# Patient Record
Sex: Female | Born: 1985 | Hispanic: No | Marital: Married | Smoking: Never smoker
Health system: Southern US, Community
[De-identification: ages and names within clinical notes are randomized; demographics above are authoritative.]

## PROBLEM LIST (undated history)

## (undated) ENCOUNTER — Inpatient Hospital Stay (HOSPITAL_COMMUNITY): Payer: Self-pay

## (undated) DIAGNOSIS — D649 Anemia, unspecified: Secondary | ICD-10-CM

## (undated) DIAGNOSIS — R002 Palpitations: Secondary | ICD-10-CM

## (undated) HISTORY — PX: TONSILLECTOMY: SUR1361

## (undated) HISTORY — DX: Palpitations: R00.2

## (undated) HISTORY — DX: Anemia, unspecified: D64.9

---

## 2007-07-10 ENCOUNTER — Emergency Department (HOSPITAL_COMMUNITY): Admission: EM | Admit: 2007-07-10 | Discharge: 2007-07-10 | Payer: Self-pay | Admitting: Emergency Medicine

## 2007-08-18 ENCOUNTER — Emergency Department (HOSPITAL_COMMUNITY): Admission: EM | Admit: 2007-08-18 | Discharge: 2007-08-18 | Payer: Self-pay | Admitting: Emergency Medicine

## 2008-02-10 ENCOUNTER — Inpatient Hospital Stay (HOSPITAL_COMMUNITY): Admission: RE | Admit: 2008-02-10 | Discharge: 2008-02-12 | Payer: Self-pay | Admitting: Obstetrics

## 2008-07-08 ENCOUNTER — Emergency Department (HOSPITAL_COMMUNITY): Admission: EM | Admit: 2008-07-08 | Discharge: 2008-07-08 | Payer: Self-pay | Admitting: Emergency Medicine

## 2008-09-14 ENCOUNTER — Ambulatory Visit (HOSPITAL_COMMUNITY): Admission: RE | Admit: 2008-09-14 | Discharge: 2008-09-14 | Payer: Self-pay | Admitting: Obstetrics

## 2009-01-05 ENCOUNTER — Inpatient Hospital Stay (HOSPITAL_COMMUNITY): Admission: AD | Admit: 2009-01-05 | Discharge: 2009-01-07 | Payer: Self-pay | Admitting: Obstetrics

## 2009-07-18 ENCOUNTER — Ambulatory Visit: Payer: Self-pay | Admitting: Gynecology

## 2009-07-18 ENCOUNTER — Inpatient Hospital Stay (HOSPITAL_COMMUNITY): Admission: AD | Admit: 2009-07-18 | Discharge: 2009-07-18 | Payer: Self-pay | Admitting: Obstetrics

## 2009-12-20 ENCOUNTER — Emergency Department (HOSPITAL_COMMUNITY)
Admission: EM | Admit: 2009-12-20 | Discharge: 2009-12-20 | Disposition: A | Discharge status: Other Acute Inpatient Hospital | Payer: Self-pay | Source: Home / Self Care | Admitting: Emergency Medicine

## 2009-12-22 ENCOUNTER — Inpatient Hospital Stay (HOSPITAL_COMMUNITY): Admission: AD | Admit: 2009-12-22 | Discharge: 2009-12-22 | Payer: Self-pay | Admitting: Obstetrics

## 2010-02-12 ENCOUNTER — Encounter: Payer: Self-pay | Admitting: Obstetrics

## 2010-02-20 ENCOUNTER — Inpatient Hospital Stay (HOSPITAL_COMMUNITY)
Admission: RE | Admit: 2010-02-20 | Discharge: 2010-02-22 | DRG: 775 | Disposition: A | Discharge status: Home / Self Care | Payer: Medicaid Other | Attending: Obstetrics | Admitting: Obstetrics

## 2010-02-20 ENCOUNTER — Encounter (HOSPITAL_COMMUNITY): Payer: Self-pay | Admitting: Obstetrics

## 2010-02-20 LAB — CBC
HCT: 27.4 % — ABNORMAL LOW (ref 36.0–46.0)
Hemoglobin: 8.1 g/dL — ABNORMAL LOW (ref 12.0–15.0)
MCH: 20.3 pg — ABNORMAL LOW (ref 26.0–34.0)
MCHC: 29.6 g/dL — ABNORMAL LOW (ref 30.0–36.0)
MCV: 68.5 fL — ABNORMAL LOW (ref 78.0–100.0)
Platelets: 244 10*3/uL (ref 150–400)
RBC: 4 MIL/uL (ref 3.87–5.11)
RDW: 17.9 % — ABNORMAL HIGH (ref 11.5–15.5)
WBC: 7.8 10*3/uL (ref 4.0–10.5)

## 2010-02-20 LAB — RPR: RPR Ser Ql: NONREACTIVE

## 2010-02-21 LAB — CBC
HCT: 22.5 % — ABNORMAL LOW (ref 36.0–46.0)
Hemoglobin: 6.7 g/dL — CL (ref 12.0–15.0)
MCH: 20.6 pg — ABNORMAL LOW (ref 26.0–34.0)
MCHC: 29.8 g/dL — ABNORMAL LOW (ref 30.0–36.0)
MCV: 69 fL — ABNORMAL LOW (ref 78.0–100.0)
Platelets: 200 10*3/uL (ref 150–400)
RBC: 3.26 MIL/uL — ABNORMAL LOW (ref 3.87–5.11)
RDW: 18 % — ABNORMAL HIGH (ref 11.5–15.5)
WBC: 7.3 10*3/uL (ref 4.0–10.5)

## 2010-04-03 LAB — URINE CULTURE
Colony Count: >=100000
Culture  Setup Time: 201112030321

## 2010-04-03 LAB — URINALYSIS, ROUTINE W REFLEX MICROSCOPIC
Bilirubin Urine: NEGATIVE
Glucose, UA: NEGATIVE mg/dL
Ketones, ur: NEGATIVE mg/dL
Nitrite: NEGATIVE
Protein, ur: NEGATIVE mg/dL
Specific Gravity, Urine: 1.025 (ref 1.005–1.030)
Urobilinogen, UA: 0.2 mg/dL (ref 0.0–1.0)
pH: 6.5 (ref 5.0–8.0)

## 2010-04-03 LAB — URINE MICROSCOPIC-ADD ON

## 2010-04-08 LAB — CBC
HCT: 32.3 % — ABNORMAL LOW (ref 36.0–46.0)
Hemoglobin: 11 g/dL — ABNORMAL LOW (ref 12.0–15.0)
MCH: 28.1 pg (ref 26.0–34.0)
MCHC: 34.2 g/dL (ref 30.0–36.0)
MCV: 82.4 fL (ref 78.0–100.0)
Platelets: 301 10*3/uL (ref 150–400)
RBC: 3.92 MIL/uL (ref 3.87–5.11)
RDW: 13.7 % (ref 11.5–15.5)
WBC: 7.6 10*3/uL (ref 4.0–10.5)

## 2010-04-08 LAB — DIFFERENTIAL
Basophils Absolute: 0 10*3/uL (ref 0.0–0.1)
Basophils Relative: 0 % (ref 0–1)
Eosinophils Absolute: 0.1 10*3/uL (ref 0.0–0.7)
Eosinophils Relative: 1 % (ref 0–5)
Lymphocytes Relative: 13 % (ref 12–46)
Lymphs Abs: 1 10*3/uL (ref 0.7–4.0)
Monocytes Absolute: 0.5 10*3/uL (ref 0.1–1.0)
Monocytes Relative: 7 % (ref 3–12)
Neutro Abs: 6.1 10*3/uL (ref 1.7–7.7)
Neutrophils Relative %: 79 % — ABNORMAL HIGH (ref 43–77)

## 2010-04-08 LAB — URINALYSIS, ROUTINE W REFLEX MICROSCOPIC
Bilirubin Urine: NEGATIVE
Glucose, UA: NEGATIVE mg/dL
Hgb urine dipstick: NEGATIVE
Ketones, ur: NEGATIVE mg/dL
Nitrite: NEGATIVE
Protein, ur: NEGATIVE mg/dL
Specific Gravity, Urine: 1.015 (ref 1.005–1.030)
Urobilinogen, UA: 0.2 mg/dL (ref 0.0–1.0)
pH: 7 (ref 5.0–8.0)

## 2010-04-08 LAB — WET PREP, GENITAL
Clue Cells Wet Prep HPF POC: NONE SEEN
Trich, Wet Prep: NONE SEEN

## 2010-04-08 LAB — POCT PREGNANCY, URINE: Preg Test, Ur: POSITIVE

## 2010-04-08 LAB — URINE CULTURE: Colony Count: 30000

## 2010-04-08 LAB — GC/CHLAMYDIA PROBE AMP, GENITAL
Chlamydia, DNA Probe: NEGATIVE
GC Probe Amp, Genital: NEGATIVE

## 2010-04-08 LAB — URINE MICROSCOPIC-ADD ON

## 2010-04-08 LAB — HCG, QUANTITATIVE, PREGNANCY: hCG, Beta Chain, Quant, S: 325655 m[IU]/mL — ABNORMAL HIGH (ref <5)

## 2010-04-08 LAB — ABO/RH: ABO/RH(D): AB POS

## 2010-04-23 LAB — CBC
HCT: 22.1 % — ABNORMAL LOW (ref 36.0–46.0)
HCT: 26.3 % — ABNORMAL LOW (ref 36.0–46.0)
Hemoglobin: 7 g/dL — ABNORMAL LOW (ref 12.0–15.0)
Hemoglobin: 8.3 g/dL — ABNORMAL LOW (ref 12.0–15.0)
MCHC: 31.4 g/dL (ref 30.0–36.0)
MCHC: 31.6 g/dL (ref 30.0–36.0)
MCV: 67.7 fL — ABNORMAL LOW (ref 78.0–100.0)
MCV: 68.6 fL — ABNORMAL LOW (ref 78.0–100.0)
Platelets: 228 10*3/uL (ref 150–400)
Platelets: 277 10*3/uL (ref 150–400)
RBC: 3.23 MIL/uL — ABNORMAL LOW (ref 3.87–5.11)
RBC: 3.88 MIL/uL (ref 3.87–5.11)
RDW: 19.4 % — ABNORMAL HIGH (ref 11.5–15.5)
RDW: 19.4 % — ABNORMAL HIGH (ref 11.5–15.5)
WBC: 7.3 10*3/uL (ref 4.0–10.5)
WBC: 8.8 10*3/uL (ref 4.0–10.5)

## 2010-04-23 LAB — RPR: RPR Ser Ql: NONREACTIVE

## 2010-04-30 LAB — URINALYSIS, ROUTINE W REFLEX MICROSCOPIC
Bilirubin Urine: NEGATIVE
Glucose, UA: NEGATIVE mg/dL
Hgb urine dipstick: NEGATIVE
Ketones, ur: NEGATIVE mg/dL
Nitrite: NEGATIVE
Protein, ur: NEGATIVE mg/dL
Specific Gravity, Urine: 1.026 (ref 1.005–1.030)
Urobilinogen, UA: 0.2 mg/dL (ref 0.0–1.0)
pH: 7 (ref 5.0–8.0)

## 2010-04-30 LAB — URINE MICROSCOPIC-ADD ON

## 2010-04-30 LAB — BASIC METABOLIC PANEL
BUN: 9 mg/dL (ref 6–23)
CO2: 21 mEq/L (ref 19–32)
Calcium: 9.6 mg/dL (ref 8.4–10.5)
Chloride: 105 mEq/L (ref 96–112)
Creatinine, Ser: 0.47 mg/dL (ref 0.4–1.2)
GFR calc Af Amer: >60 mL/min (ref >60)
GFR calc non Af Amer: >60 mL/min (ref >60)
Glucose, Bld: 99 mg/dL (ref 70–99)
Potassium: 3.3 mEq/L — ABNORMAL LOW (ref 3.5–5.1)
Sodium: 137 mEq/L (ref 135–145)

## 2010-04-30 LAB — CBC
HCT: 34.8 % — ABNORMAL LOW (ref 36.0–46.0)
Hemoglobin: 11.8 g/dL — ABNORMAL LOW (ref 12.0–15.0)
MCHC: 34 g/dL (ref 30.0–36.0)
MCV: 81.9 fL (ref 78.0–100.0)
Platelets: 354 10*3/uL (ref 150–400)
RBC: 4.24 MIL/uL (ref 3.87–5.11)
RDW: 12.7 % (ref 11.5–15.5)
WBC: 7.2 10*3/uL (ref 4.0–10.5)

## 2010-04-30 LAB — D-DIMER, QUANTITATIVE: D-Dimer, Quant: 0.62 ug/mL-FEU — ABNORMAL HIGH (ref 0.00–0.48)

## 2010-05-07 LAB — CBC
HCT: 20.7 % — ABNORMAL LOW (ref 36.0–46.0)
HCT: 28.1 % — ABNORMAL LOW (ref 36.0–46.0)
Hemoglobin: 6.8 g/dL — CL (ref 12.0–15.0)
Hemoglobin: 9.3 g/dL — ABNORMAL LOW (ref 12.0–15.0)
MCHC: 32.6 g/dL (ref 30.0–36.0)
MCHC: 32.9 g/dL (ref 30.0–36.0)
MCV: 74.5 fL — ABNORMAL LOW (ref 78.0–100.0)
MCV: 74.9 fL — ABNORMAL LOW (ref 78.0–100.0)
Platelets: 206 10*3/uL (ref 150–400)
Platelets: 331 10*3/uL (ref 150–400)
RBC: 2.77 MIL/uL — ABNORMAL LOW (ref 3.87–5.11)
RBC: 3.78 MIL/uL — ABNORMAL LOW (ref 3.87–5.11)
RDW: 17.2 % — ABNORMAL HIGH (ref 11.5–15.5)
RDW: 17.8 % — ABNORMAL HIGH (ref 11.5–15.5)
WBC: 7 10*3/uL (ref 4.0–10.5)
WBC: 9.8 10*3/uL (ref 4.0–10.5)

## 2010-05-07 LAB — RPR: RPR Ser Ql: NONREACTIVE

## 2010-10-18 LAB — URINALYSIS, ROUTINE W REFLEX MICROSCOPIC
Bilirubin Urine: NEGATIVE
Glucose, UA: NEGATIVE
Hgb urine dipstick: NEGATIVE
Ketones, ur: 40 — AB
Nitrite: NEGATIVE
Protein, ur: NEGATIVE
Specific Gravity, Urine: 1.015
Urobilinogen, UA: 0.2
pH: 5.5

## 2010-10-18 LAB — URINE MICROSCOPIC-ADD ON

## 2010-10-18 LAB — GC/CHLAMYDIA PROBE AMP, GENITAL
Chlamydia, DNA Probe: NEGATIVE
GC Probe Amp, Genital: NEGATIVE

## 2010-10-18 LAB — WET PREP, GENITAL
Trich, Wet Prep: NONE SEEN
Yeast Wet Prep HPF POC: NONE SEEN

## 2010-10-18 LAB — HCG, QUANTITATIVE, PREGNANCY: hCG, Beta Chain, Quant, S: >200000 — ABNORMAL HIGH

## 2010-10-18 LAB — ABO/RH: ABO/RH(D): AB POS

## 2010-10-19 LAB — COMPREHENSIVE METABOLIC PANEL
ALT: 22
AST: 25
Albumin: 3.4 — ABNORMAL LOW
Alkaline Phosphatase: 40
BUN: 5 — ABNORMAL LOW
CO2: 26
Calcium: 9.2
Chloride: 106
Creatinine, Ser: 0.31 — ABNORMAL LOW
GFR calc Af Amer: >60
GFR calc non Af Amer: >60
Glucose, Bld: 85
Potassium: 3.6
Sodium: 136
Total Bilirubin: 0.4
Total Protein: 6.5

## 2010-10-19 LAB — CBC
HCT: 29.9 — ABNORMAL LOW
Hemoglobin: 10.3 — ABNORMAL LOW
MCHC: 34.3
MCV: 86.9
Platelets: 272
RBC: 3.44 — ABNORMAL LOW
RDW: 13.5
WBC: 7.1

## 2010-10-19 LAB — DIFFERENTIAL
Basophils Absolute: 0.1
Basophils Relative: 1
Eosinophils Absolute: 0.1
Eosinophils Relative: 1
Lymphocytes Relative: 21
Lymphs Abs: 1.5
Monocytes Absolute: 0.5
Monocytes Relative: 7
Neutro Abs: 5
Neutrophils Relative %: 70

## 2010-10-19 LAB — LIPASE, BLOOD: Lipase: 19

## 2010-12-24 LAB — OB RESULTS CONSOLE GC/CHLAMYDIA
Chlamydia: NEGATIVE
Gonorrhea: NEGATIVE

## 2010-12-24 LAB — OB RESULTS CONSOLE RUBELLA ANTIBODY, IGM: Rubella: IMMUNE

## 2010-12-24 LAB — OB RESULTS CONSOLE HIV ANTIBODY (ROUTINE TESTING): HIV: NONREACTIVE

## 2010-12-24 LAB — OB RESULTS CONSOLE ABO/RH: RH Type: POSITIVE

## 2010-12-24 LAB — OB RESULTS CONSOLE ANTIBODY SCREEN: Antibody Screen: NEGATIVE

## 2010-12-24 LAB — OB RESULTS CONSOLE RPR: RPR: NONREACTIVE

## 2010-12-24 LAB — OB RESULTS CONSOLE HEPATITIS B SURFACE ANTIGEN: Hepatitis B Surface Ag: NEGATIVE

## 2011-01-22 NOTE — L&D Delivery Note (Signed)
Delivery Note At 6:39 PM a viable female was delivered via  (Presentation: Left Occiput Anterior).  APGAR: 9, 9; weight .   Placenta status: Intact, Spontaneous.  Cord: 3 vessels with the following complications: None.  Cord pH: not done  Anesthesia: Epidural  Episiotomy:  Lacerations: 2nd degree Suture Repair: 2.0 vicryl Est. Blood Loss (mL):   Mom to postpartum.  Baby to nursery-stable.  Nuno Brubacher A 06/19/2011, 7:15 PM

## 2011-03-25 ENCOUNTER — Other Ambulatory Visit (HOSPITAL_COMMUNITY): Payer: Self-pay | Admitting: Obstetrics

## 2011-03-25 DIAGNOSIS — Z3689 Encounter for other specified antenatal screening: Secondary | ICD-10-CM

## 2011-03-26 ENCOUNTER — Ambulatory Visit (HOSPITAL_COMMUNITY)
Admission: RE | Admit: 2011-03-26 | Discharge: 2011-03-26 | Disposition: A | Discharge status: Home / Self Care | Payer: Medicaid Other | Source: Ambulatory Visit | Attending: Obstetrics | Admitting: Obstetrics

## 2011-03-26 DIAGNOSIS — Z363 Encounter for antenatal screening for malformations: Secondary | ICD-10-CM | POA: Insufficient documentation

## 2011-03-26 DIAGNOSIS — Z1389 Encounter for screening for other disorder: Secondary | ICD-10-CM | POA: Insufficient documentation

## 2011-03-26 DIAGNOSIS — O358XX0 Maternal care for other (suspected) fetal abnormality and damage, not applicable or unspecified: Secondary | ICD-10-CM | POA: Insufficient documentation

## 2011-03-26 DIAGNOSIS — Z3689 Encounter for other specified antenatal screening: Secondary | ICD-10-CM

## 2011-06-07 ENCOUNTER — Telehealth (HOSPITAL_COMMUNITY): Payer: Self-pay | Admitting: *Deleted

## 2011-06-07 ENCOUNTER — Encounter (HOSPITAL_COMMUNITY): Payer: Self-pay | Admitting: *Deleted

## 2011-06-07 NOTE — Telephone Encounter (Signed)
Preadmission screen  

## 2011-06-11 ENCOUNTER — Encounter (HOSPITAL_COMMUNITY): Payer: Self-pay | Admitting: *Deleted

## 2011-06-11 ENCOUNTER — Telehealth (HOSPITAL_COMMUNITY): Payer: Self-pay | Admitting: *Deleted

## 2011-06-11 NOTE — Telephone Encounter (Signed)
Preadmission screen  

## 2011-06-18 ENCOUNTER — Encounter (HOSPITAL_COMMUNITY): Payer: Self-pay | Admitting: *Deleted

## 2011-06-18 ENCOUNTER — Inpatient Hospital Stay (HOSPITAL_COMMUNITY)
Admission: AD | Admit: 2011-06-18 | Discharge: 2011-06-18 | Disposition: A | Discharge status: Hospice | Payer: Medicaid Other | Source: Ambulatory Visit | Attending: Obstetrics | Admitting: Obstetrics

## 2011-06-18 DIAGNOSIS — O479 False labor, unspecified: Secondary | ICD-10-CM | POA: Insufficient documentation

## 2011-06-18 DIAGNOSIS — S3140XA Unspecified open wound of vagina and vulva, initial encounter: Secondary | ICD-10-CM | POA: Insufficient documentation

## 2011-06-18 NOTE — MAU Note (Signed)
Pt reports pain in lower back and some bleeding. States it feels like the baby is pushing down

## 2011-06-18 NOTE — Discharge Instructions (Signed)
Braxton Hicks Contractions Pregnancy is commonly associated with contractions of the uterus throughout the pregnancy. Towards the end of pregnancy (32 to 34 weeks), these contractions [facility] Rough) can develop more often and may become more forceful. This is not true labor because these contractions do not result in opening (dilatation) and thinning of the cervix. They are sometimes difficult to tell apart from true labor because these contractions can be forceful and people have different pain tolerances. You should not feel embarrassed if you go to the hospital with false labor. Sometimes, the only way to tell if you are in true labor is for your caregiver to follow the changes in the cervix. How to tell the difference between true and false labor:  False labor.   The contractions of false labor are usually shorter, irregular and not as hard as those of true labor.   They are often felt in the front of the lower abdomen and in the groin.   They may leave with walking around or changing positions while lying down.   They get weaker and are shorter lasting as time goes on.   These contractions are usually irregular.   They do not usually become progressively stronger, regular and closer together as with true labor.   True labor.   Contractions in true labor last 30 to 70 seconds, become very regular, usually become more intense, and increase in frequency.   They do not go away with walking.   The discomfort is usually felt in the top of the uterus and spreads to the lower abdomen and low back.   True labor can be determined by your caregiver with an exam. This will show that the cervix is dilating and getting thinner.  If there are no prenatal problems or other health problems associated with the pregnancy, it is completely safe to be sent home with false labor and await the onset of true labor. HOME CARE INSTRUCTIONS   Keep up with your usual exercises and instructions.   Take  medications as directed.   Keep your regular prenatal appointment.   Eat and drink lightly if you think you are going into labor.   If BH contractions are making you uncomfortable:   Change your activity position from lying down or resting to walking/walking to resting.   Sit and rest in a tub of warm water.   Drink 2 to 3 glasses of water. Dehydration may cause B-H contractions.   Do slow and deep breathing several times an hour.  SEEK IMMEDIATE MEDICAL CARE IF:   Your contractions continue to become stronger, more regular, and closer together.   You have a gushing, burst or leaking of fluid from the vagina.   An oral temperature above 102 F (38.9 C) develops.   You have passage of blood-tinged mucus.   You develop vaginal bleeding.   You develop continuous belly (abdominal) pain.   You have low back pain that you never had before.   You feel the baby's head pushing down causing pelvic pressure.   The baby is not moving as much as it used to.  Document Released: 01/07/2005 Document Revised: 12/27/2010 Document Reviewed: 07/01/2008 [facility] Patient Information 2012 [place], [place].Labor Induction  Most women go into labor on their own between 41 and 42 weeks of the pregnancy. When this does not happen or when there is a medical need, medicine or other methods may be used to induce labor. Labor induction causes a pregnant woman's uterus to contract. It  also causes the cervix to soften (ripen), open (dilate), and thin out (efface). Usually, labor is not induced before 39 weeks of the pregnancy unless there is a problem with the baby or mother. Whether your labor will be induced depends on a number of factors, including the following:  The medical condition of you and the baby.   How many weeks along you are.   The status of baby's lung maturity.   The condition of the cervix.   The position of the baby.  REASONS FOR LABOR INDUCTION  The health of the baby or mother  is at risk.   The pregnancy is overdue by 1 week or more.   The water breaks but labor does not start on its own.   The mother has a health condition or serious illness such as high blood pressure, infection, placental abruption, or diabetes.   The amniotic fluid amounts are low around the baby.   The baby is distressed.  REASONS TO NOT INDUCE LABOR Labor induction may not be a good idea if:  It is shown that your baby does not tolerate labor.   An induction is just more convenient.   You want the baby to be born on a certain date, like a holiday.   You have had previous surgeries on your uterus, such as a myomectomy or the removal of fibroids.   Your placenta lies very low in the uterus and blocks the opening of the cervix (placenta previa).   Your baby is not in a head down position.   The umbilical cord drops down into the birth canal in front of the baby. This could cut off the baby's blood and oxygen supply.   You have had a previous cesarean delivery.   There areunusual circumstances, such as the baby being extremely premature.  RISKS AND COMPLICATIONS Problems may occur in the process of induction and plans may need to be modified as a situation unfolds. Some of the risks of induction include:  Change in fetal heart rate, such as too high, too low, or erratic.   Risk of fetal distress.   Risk of infection to mother and baby.   Increased chance of having a cesarean delivery.   The rare, but increased chance that the placenta will separate from the uterus (abruption).   Uterine rupture (very rare).  When induction is needed for medical reasons, the benefits of induction may outweigh the risks. BEFORE THE PROCEDURE Your caregiver will check your cervix and the baby's position. This will help your caregiver decide if you are far enough along for an induction to work. PROCEDURE Several methods of labor induction may be used, such as:   Taking prostaglandin  medicine to dilate and ripen the cervix. The medicine will also start contractions. It can be taken by mouth or by inserting a suppository into the vagina.   A thin tube (catheter) with a balloon on the end may be inserted into your vagina to dilate the cervix. Once inserted, the balloon expands with water, which causes the cervix to open.   Striping the membranes. Your caregiver inserts a finger between the cervix and membranes, which causes the cervix to be stretched and may cause the uterus to contract. This is often done during an office visit. You will be sent home to wait for the contractions to begin. You will then come in for an induction.   Breaking the water. Your caregiver will make a hole in the amniotic sac using  a small instrument. Once the amniotic sac breaks, contractions should begin. This may still take hours to see an effect.   Taking medicine to trigger or strengthen contractions. This medicine is given intravenously through a tube in your arm.  All of the methods of induction, besides stripping the membranes, will be done in the hospital. Induction is done in the hospital so that you and the baby can be carefully monitored. AFTER THE PROCEDURE Some inductions can take up to 2 or 3 days. Depending on the cervix, it usually takes less time. It takes longer when you are induced early in the pregnancy or if this is your first pregnancy. If a mother is still pregnant and the induction has been going on for 2 to 3 days, either the mother will be sent home or a cesarean delivery will be needed. Document Released: 05/29/2006 Document Revised: 12/27/2010 Document Reviewed: 11/12/2010 [facility] Patient Information 2012 [place], [place].

## 2011-06-18 NOTE — MAU Provider Note (Signed)
Chief Complaint:  Labor Eval    First Provider Initiated Contact with Patient 06/18/11 0134      April Blanchard is  26 y.o. [id number].  No LMP recorded. Patient is pregnant..  [redacted]w[redacted]d   She presents complaining of Labor Eval  Reports ? Labial bleeding from wax bikini wax earlier today. States bleeding started after wax. Only spotting on occasion with wiping.  Obstetrical/Gynecological History: OB History    Grav Para Term Preterm Abortions TAB SAB Ect Mult Living   4 3 3       3       Past Medical History: Past Medical History  Diagnosis Date  . Asthma     albuterol i month ago  . Anemia   . Palpitations     Past Surgical History: Past Surgical History  Procedure Date  . Tonsillectomy     Family History: Family History  Problem Relation Age of Onset  . Diabetes Mother   . Asthma Brother   . Asthma Maternal Uncle   . Diabetes Maternal Grandmother     Social History: History  Substance Use Topics  . Smoking status: Never Smoker   . Smokeless tobacco: Never Used  . Alcohol Use: No    Allergies: No Known Allergies  Prescriptions prior to admission  Medication Sig Dispense Refill  . albuterol (PROVENTIL HFA;VENTOLIN HFA) 108 (90 BASE) MCG/ACT inhaler Inhale 2 puffs into the lungs every 6 (six) hours as needed. As needed for asthma      . ferrous sulfate 325 (65 FE) MG tablet Take 325 mg by mouth daily with breakfast.      . PRESCRIPTION MEDICATION Take 1 tablet by mouth daily. Patient states she has an antibiotic she takes 1 tablet daily for 7 days but does not know the name of the drug. Please follow-up        Review of Systems - Negative except what has been reviewed in the HPI  Physical Exam   Blood pressure 112/65, pulse 100, temperature 97.4 F (36.3 C), temperature source Oral, resp. rate 16, height 5\' 2"  (1.575 m), weight 172 lb (78.019 kg), SpO2 100.00%.  General: General appearance - alert, well appearing, and in no distress and oriented to person, place,  and time Mental status - alert, oriented to person, place, and time, normal mood, behavior, speech, dress, motor activity, and thought processes, affect appropriate to mood Abdomen - gravid, non tender Focused Gynecological Exam: VULVA: left labial lac noted, hemostatic, VAGINA: normal appearing vagina with normal color and discharge, no lesions, CERVIX: FT/50/ballotable/post per RN exam  Assessment: False Labor Stable Left Labial Laceration  Plan: Discharge home Labor precautions Reassurance given and comfort measures for labial lac FU Wednesday as scheduled for IOL  Wallice Granville E. 06/18/2011,1:48 AM

## 2011-06-19 ENCOUNTER — Encounter (HOSPITAL_COMMUNITY): Payer: Self-pay | Admitting: Anesthesiology

## 2011-06-19 ENCOUNTER — Inpatient Hospital Stay (HOSPITAL_COMMUNITY): Payer: Medicaid Other | Admitting: Anesthesiology

## 2011-06-19 ENCOUNTER — Encounter (HOSPITAL_COMMUNITY): Payer: Self-pay

## 2011-06-19 ENCOUNTER — Inpatient Hospital Stay (HOSPITAL_COMMUNITY)
Admission: RE | Admit: 2011-06-19 | Discharge: 2011-06-21 | DRG: 775 | Disposition: A | Discharge status: Home / Self Care | Payer: Medicaid Other | Source: Ambulatory Visit | Attending: Obstetrics | Admitting: Obstetrics

## 2011-06-19 LAB — RPR: RPR Ser Ql: NONREACTIVE

## 2011-06-19 LAB — OB RESULTS CONSOLE GBS: GBS: NEGATIVE

## 2011-06-19 LAB — CBC
HCT: 30.5 % — ABNORMAL LOW (ref 36.0–46.0)
Hemoglobin: 9.3 g/dL — ABNORMAL LOW (ref 12.0–15.0)
MCH: 23.3 pg — ABNORMAL LOW (ref 26.0–34.0)
MCHC: 30.5 g/dL (ref 30.0–36.0)
MCV: 76.4 fL — ABNORMAL LOW (ref 78.0–100.0)
Platelets: 208 10*3/uL (ref 150–400)
RBC: 3.99 MIL/uL (ref 3.87–5.11)
RDW: 20.8 % — ABNORMAL HIGH (ref 11.5–15.5)
WBC: 6 10*3/uL (ref 4.0–10.5)

## 2011-06-19 MED ORDER — LACTATED RINGERS IV SOLN
INTRAVENOUS | Status: DC
  Administered 2011-06-19: 1000 mL via INTRAVENOUS

## 2011-06-19 MED ORDER — LANOLIN HYDROUS EX OINT: TOPICAL_OINTMENT | CUTANEOUS | Status: DC | PRN

## 2011-06-19 MED ORDER — ACETAMINOPHEN 325 MG PO TABS
650.0000 mg | ORAL_TABLET | ORAL | Status: DC | PRN
  Administered 2011-06-19: 650 mg via ORAL
  Filled 2011-06-19: qty 2

## 2011-06-19 MED ORDER — EPHEDRINE 5 MG/ML INJ: 10.0000 mg | INTRAVENOUS | Status: DC | PRN

## 2011-06-19 MED ORDER — SODIUM BICARBONATE 8.4 % IV SOLN
INTRAVENOUS | Status: DC | PRN
  Administered 2011-06-19: 5 mL via EPIDURAL

## 2011-06-19 MED ORDER — TERBUTALINE SULFATE 1 MG/ML IJ SOLN: 0.2500 mg | Freq: Once | INTRAMUSCULAR | Status: DC | PRN

## 2011-06-19 MED ORDER — PRENATAL MULTIVITAMIN CH
1.0000 | ORAL_TABLET | Freq: Every day | ORAL | Status: DC
  Administered 2011-06-20: 1 via ORAL
  Administered 2011-06-21: 12:00:00 via ORAL
  Filled 2011-06-19 (×2): qty 1

## 2011-06-19 MED ORDER — SIMETHICONE 80 MG PO CHEW: 80.0000 mg | CHEWABLE_TABLET | ORAL | Status: DC | PRN

## 2011-06-19 MED ORDER — ONDANSETRON HCL 4 MG PO TABS: 4.0000 mg | ORAL_TABLET | ORAL | Status: DC | PRN

## 2011-06-19 MED ORDER — ERYTHROMYCIN 5 MG/GM OP OINT
TOPICAL_OINTMENT | OPHTHALMIC | Status: AC
  Filled 2011-06-19: qty 1

## 2011-06-19 MED ORDER — IBUPROFEN 600 MG PO TABS
600.0000 mg | ORAL_TABLET | Freq: Four times a day (QID) | ORAL | Status: DC
  Administered 2011-06-20 – 2011-06-21 (×6): 600 mg via ORAL
  Filled 2011-06-19 (×2): qty 1

## 2011-06-19 MED ORDER — LIDOCAINE HCL (PF) 1 % IJ SOLN
INTRAMUSCULAR | Status: DC | PRN
  Administered 2011-06-19 (×2): 8 mL

## 2011-06-19 MED ORDER — FLEET ENEMA 7-19 GM/118ML RE ENEM: 1.0000 | ENEMA | RECTAL | Status: DC | PRN

## 2011-06-19 MED ORDER — OXYTOCIN 20 UNITS IN LACTATED RINGERS INFUSION - SIMPLE: 125.0000 mL/h | Freq: Once | INTRAVENOUS | Status: DC

## 2011-06-19 MED ORDER — ALBUTEROL SULFATE HFA 108 (90 BASE) MCG/ACT IN AERS
2.0000 | INHALATION_SPRAY | RESPIRATORY_TRACT | Status: DC
  Administered 2011-06-20 – 2011-06-21 (×7): 2 via RESPIRATORY_TRACT
  Filled 2011-06-19: qty 6.7

## 2011-06-19 MED ORDER — OXYTOCIN 10 UNIT/ML IJ SOLN
INTRAMUSCULAR | Status: AC
  Administered 2011-06-19: 20 [IU]
  Filled 2011-06-19: qty 2

## 2011-06-19 MED ORDER — OXYTOCIN BOLUS FROM INFUSION
500.0000 mL | Freq: Once | INTRAVENOUS | Status: DC
  Filled 2011-06-19: qty 500

## 2011-06-19 MED ORDER — LACTATED RINGERS IV SOLN: 500.0000 mL | Freq: Once | INTRAVENOUS | Status: DC

## 2011-06-19 MED ORDER — OXYTOCIN 20 UNITS IN LACTATED RINGERS INFUSION - SIMPLE
125.0000 m[IU]/min | INTRAVENOUS | Status: DC
  Administered 2011-06-19: 12 m[IU]/min via INTRAVENOUS
  Administered 2011-06-19: 4 m[IU]/min via INTRAVENOUS
  Administered 2011-06-19: 8 m[IU]/min via INTRAVENOUS
  Administered 2011-06-19: 1 m[IU]/min via INTRAVENOUS
  Filled 2011-06-19: qty 1000

## 2011-06-19 MED ORDER — BENZOCAINE-MENTHOL 20-0.5 % EX AERO: 1.0000 "application " | INHALATION_SPRAY | CUTANEOUS | Status: DC | PRN

## 2011-06-19 MED ORDER — EPHEDRINE 5 MG/ML INJ
10.0000 mg | INTRAVENOUS | Status: DC | PRN
  Filled 2011-06-19: qty 4

## 2011-06-19 MED ORDER — DIPHENHYDRAMINE HCL 50 MG/ML IJ SOLN: 12.5000 mg | INTRAMUSCULAR | Status: DC | PRN

## 2011-06-19 MED ORDER — OXYTOCIN 20 UNITS IN LACTATED RINGERS INFUSION - SIMPLE: 1.0000 m[IU]/min | INTRAVENOUS | Status: DC

## 2011-06-19 MED ORDER — WITCH HAZEL-GLYCERIN EX PADS: 1.0000 "application " | MEDICATED_PAD | CUTANEOUS | Status: DC | PRN

## 2011-06-19 MED ORDER — TETANUS-DIPHTH-ACELL PERTUSSIS 5-2.5-18.5 LF-MCG/0.5 IM SUSP: 0.5000 mL | Freq: Once | INTRAMUSCULAR | Status: DC

## 2011-06-19 MED ORDER — LACTATED RINGERS IV SOLN
500.0000 mL | INTRAVENOUS | Status: DC | PRN
  Administered 2011-06-19: 1000 mL via INTRAVENOUS

## 2011-06-19 MED ORDER — DIPHENHYDRAMINE HCL 25 MG PO CAPS: 25.0000 mg | ORAL_CAPSULE | Freq: Four times a day (QID) | ORAL | Status: DC | PRN

## 2011-06-19 MED ORDER — SENNOSIDES-DOCUSATE SODIUM 8.6-50 MG PO TABS
2.0000 | ORAL_TABLET | Freq: Every day | ORAL | Status: DC
  Administered 2011-06-19 – 2011-06-20 (×2): 2 via ORAL

## 2011-06-19 MED ORDER — DIBUCAINE 1 % RE OINT
1.0000 "application " | TOPICAL_OINTMENT | RECTAL | Status: DC | PRN
  Administered 2011-06-19: 1 via RECTAL
  Filled 2011-06-19: qty 28

## 2011-06-19 MED ORDER — METHYLERGONOVINE MALEATE 0.2 MG/ML IJ SOLN
INTRAMUSCULAR | Status: AC
  Filled 2011-06-19: qty 1

## 2011-06-19 MED ORDER — CITRIC ACID-SODIUM CITRATE 334-500 MG/5ML PO SOLN: 30.0000 mL | ORAL | Status: DC | PRN

## 2011-06-19 MED ORDER — ONDANSETRON HCL 4 MG/2ML IJ SOLN: 4.0000 mg | INTRAMUSCULAR | Status: DC | PRN

## 2011-06-19 MED ORDER — PHENYLEPHRINE 40 MCG/ML (10ML) SYRINGE FOR IV PUSH (FOR BLOOD PRESSURE SUPPORT): 80.0000 ug | PREFILLED_SYRINGE | INTRAVENOUS | Status: DC | PRN

## 2011-06-19 MED ORDER — BUTORPHANOL TARTRATE 2 MG/ML IJ SOLN
1.0000 mg | INTRAMUSCULAR | Status: DC | PRN
  Administered 2011-06-19: 1 mg via INTRAVENOUS
  Filled 2011-06-19: qty 1

## 2011-06-19 MED ORDER — OXYCODONE-ACETAMINOPHEN 5-325 MG PO TABS
1.0000 | ORAL_TABLET | ORAL | Status: DC | PRN
  Filled 2011-06-19 (×2): qty 1

## 2011-06-19 MED ORDER — FENTANYL 2.5 MCG/ML BUPIVACAINE 1/10 % EPIDURAL INFUSION (WH - ANES)
INTRAMUSCULAR | Status: DC | PRN
  Administered 2011-06-19: 14 mL/h via EPIDURAL

## 2011-06-19 MED ORDER — ONDANSETRON HCL 4 MG/2ML IJ SOLN: 4.0000 mg | Freq: Four times a day (QID) | INTRAMUSCULAR | Status: DC | PRN

## 2011-06-19 MED ORDER — OXYCODONE-ACETAMINOPHEN 5-325 MG PO TABS
1.0000 | ORAL_TABLET | ORAL | Status: DC | PRN
  Administered 2011-06-20 – 2011-06-21 (×4): 1 via ORAL
  Filled 2011-06-19 (×2): qty 1

## 2011-06-19 MED ORDER — FERROUS SULFATE 325 (65 FE) MG PO TABS
325.0000 mg | ORAL_TABLET | Freq: Every day | ORAL | Status: DC
  Administered 2011-06-20 – 2011-06-21 (×2): 325 mg via ORAL
  Filled 2011-06-19 (×3): qty 1

## 2011-06-19 MED ORDER — FENTANYL 2.5 MCG/ML BUPIVACAINE 1/10 % EPIDURAL INFUSION (WH - ANES)
14.0000 mL/h | INTRAMUSCULAR | Status: DC
  Administered 2011-06-19: 14 mL/h via EPIDURAL
  Filled 2011-06-19 (×2): qty 60

## 2011-06-19 MED ORDER — PHENYLEPHRINE 40 MCG/ML (10ML) SYRINGE FOR IV PUSH (FOR BLOOD PRESSURE SUPPORT)
80.0000 ug | PREFILLED_SYRINGE | INTRAVENOUS | Status: DC | PRN
  Filled 2011-06-19: qty 5

## 2011-06-19 MED ORDER — LIDOCAINE HCL (PF) 1 % IJ SOLN
30.0000 mL | INTRAMUSCULAR | Status: DC | PRN
  Administered 2011-06-19: 30 mL via SUBCUTANEOUS
  Filled 2011-06-19: qty 30

## 2011-06-19 MED ORDER — FERROUS SULFATE 325 (65 FE) MG PO TABS: 325.0000 mg | ORAL_TABLET | Freq: Two times a day (BID) | ORAL | Status: DC

## 2011-06-19 MED ORDER — IBUPROFEN 600 MG PO TABS
600.0000 mg | ORAL_TABLET | Freq: Four times a day (QID) | ORAL | Status: DC | PRN
  Administered 2011-06-19: 600 mg via ORAL
  Filled 2011-06-19 (×5): qty 1

## 2011-06-19 MED ORDER — ZOLPIDEM TARTRATE 5 MG PO TABS: 5.0000 mg | ORAL_TABLET | Freq: Every evening | ORAL | Status: DC | PRN

## 2011-06-19 NOTE — Anesthesia Preprocedure Evaluation (Signed)
Anesthesia Evaluation  Patient identified by MRN, date of birth, ID band Patient awake    Reviewed: Allergy & Precautions, H&P , NPO status , Patient's Chart, lab work & pertinent test results  Airway Mallampati: II TM Distance: >3 FB Neck ROM: full    Dental No notable dental hx.    Pulmonary    Pulmonary exam normal       Cardiovascular negative cardio ROS      Neuro/Psych negative neurological ROS  negative psych ROS   GI/Hepatic negative GI ROS, Neg liver ROS,   Endo/Other  negative endocrine ROS  Renal/GU negative Renal ROS  negative genitourinary   Musculoskeletal negative musculoskeletal ROS (+)   Abdominal Normal abdominal exam  (+)   Peds negative pediatric ROS (+)  Hematology negative hematology ROS (+)   Anesthesia Other Findings   Reproductive/Obstetrics (+) Pregnancy                           Anesthesia Physical Anesthesia Plan  ASA: II  Anesthesia Plan: Epidural   Post-op Pain Management:    Induction:   Airway Management Planned:   Additional Equipment:   Intra-op Plan:   Post-operative Plan:   Informed Consent: I have reviewed the patients History and Physical, chart, labs and discussed the procedure including the risks, benefits and alternatives for the proposed anesthesia with the patient or authorized representative who has indicated his/her understanding and acceptance.     Plan Discussed with:   Anesthesia Plan Comments:         Anesthesia Quick Evaluation  

## 2011-06-19 NOTE — Progress Notes (Signed)
Patient ID: April Blanchard, female   DOB: 1985-06-09, 25 y.o.   MRN: [id number] Cervix no 4 cm 85% vertex -1 station IUPC was inserted and the tracing reactive

## 2011-06-19 NOTE — H&P (Signed)
This is Dr. Francoise Ceo dictating the history and physical on  April Blanchard she's a 26 year old gravida 4 para 73 old 3 who is due date is 6-13 she's a 39 weeks and 3 days and she desires induction cervix is 2 cm 80% vertex -3 amniotomy was performed and the fluids clear her GBS is negative Past medical history negative Past surgical history negative Social history negative System review noncontributory Physical exam well-developed female in no distress HEENT negative Breasts negative Lungs clear to P&A Heart regular rhythm no murmurs no gallops Abdomen term Pelvic as described above Extremities negative and

## 2011-06-19 NOTE — Anesthesia Procedure Notes (Signed)
Epidural Patient location during procedure: OB Start time: 06/19/2011 11:52 AM End time: 06/19/2011 11:57 AM Reason for block: procedure for pain  Staffing Anesthesiologist: Sandrea Hughs Performed by: anesthesiologist   Preanesthetic Checklist Completed: patient identified, site marked, surgical consent, pre-op evaluation, timeout performed, IV checked, risks and benefits discussed and monitors and equipment checked  Epidural Patient position: sitting Prep: site prepped and draped and DuraPrep Patient monitoring: continuous pulse ox and blood pressure Approach: midline Injection technique: LOR air  Needle:  Needle type: Tuohy  Needle gauge: 17 G Needle length: 9 cm Needle insertion depth: 6 cm Catheter type: closed end flexible Catheter size: 19 Gauge Catheter at skin depth: 11 cm Test dose: negative and Other  Assessment Sensory level: T8 Events: blood not aspirated, injection not painful, no injection resistance, negative IV test and no paresthesia

## 2011-06-20 ENCOUNTER — Encounter (HOSPITAL_COMMUNITY): Payer: Self-pay

## 2011-06-20 LAB — CBC
HCT: 25 % — ABNORMAL LOW (ref 36.0–46.0)
Hemoglobin: 7.7 g/dL — ABNORMAL LOW (ref 12.0–15.0)
MCH: 23.7 pg — ABNORMAL LOW (ref 26.0–34.0)
MCHC: 30.8 g/dL (ref 30.0–36.0)
MCV: 76.9 fL — ABNORMAL LOW (ref 78.0–100.0)
Platelets: 185 10*3/uL (ref 150–400)
RBC: 3.25 MIL/uL — ABNORMAL LOW (ref 3.87–5.11)
RDW: 20.8 % — ABNORMAL HIGH (ref 11.5–15.5)
WBC: 6.9 10*3/uL (ref 4.0–10.5)

## 2011-06-20 NOTE — Progress Notes (Signed)
UR Chart review completed.  

## 2011-06-20 NOTE — Progress Notes (Signed)
Patient ID: April Blanchard, female   DOB: Jun 03, 1985, 26 y.o.   MRN: [id number] Postpartum day one Vital signs normal Fundus firm Lochia moderate Legs negative Hemoglobin 7.7 patient has no complaints she is on ferrous sulfate twice a day

## 2011-06-20 NOTE — Anesthesia Postprocedure Evaluation (Signed)
  Anesthesia Post-op Note  Patient: April Blanchard  Procedure(s) Performed: * No procedures listed *  Patient Location: PACU and Mother/Baby  Anesthesia Type: Epidural  Level of Consciousness: awake  Airway and Oxygen Therapy: Patient Spontanous Breathing  Post-op Pain: none  Post-op Assessment: Patient's Cardiovascular Status Stable, Respiratory Function Stable, Patent Airway, No signs of Nausea or vomiting, Adequate PO intake, Pain level controlled, No headache, No backache, No residual numbness and No residual motor weakness  Post-op Vital Signs: Reviewed and stable  Complications: No apparent anesthesia complications

## 2011-06-21 MED ORDER — FERROUS SULFATE 325 (65 FE) MG PO TABS: 325.0000 mg | ORAL_TABLET | Freq: Every day | ORAL | Status: DC

## 2011-06-21 NOTE — Discharge Summary (Signed)
Obstetric Discharge Summary Reason for Admission: onset of labor Prenatal Procedures: none Intrapartum Procedures: spontaneous vaginal delivery Postpartum Procedures: none Complications-Operative and Postpartum: none Hemoglobin  Date Value Range Status  06/20/2011 7.7* 12.0-15.0 (g/dL) Final     DELTA CHECK NOTED     REPEATED TO VERIFY     HCT  Date Value Range Status  06/20/2011 25.0* 36.0-46.0 (%) Final    Physical Exam:  General: alert Lochia: appropriate Uterine Fundus: firm Incision: healing well DVT Evaluation: No evidence of DVT seen on physical exam.  Discharge Diagnoses: Term Pregnancy-delivered  Discharge Information: Date: 06/21/2011 Activity: pelvic rest Diet: routine Medications: Percocet Condition: stable Instructions: refer to practice specific booklet Discharge to: home Follow-up Information    Follow up with April Blanchard A, MD. Call in 6 weeks.   Contact information:   [address] [place] [address] [phone]          Newborn Data: Live born female  Birth Weight: 6 lb 4 oz (2835 g) APGAR: 9, 9  Home with mother.  April Blanchard 06/21/2011, 6:54 AM

## 2011-06-21 NOTE — Discharge Instructions (Signed)
Discharge instructions   You can wash your hair  Shower  Eat what you want  Drink what you want  See me in 6 weeks  Your ankles are going to swell more in the next 2 weeks than when pregnant  No sex for 6 weeks   Jeiden Daughtridge A, MD 06/21/2011    

## 2011-09-02 ENCOUNTER — Other Ambulatory Visit (HOSPITAL_COMMUNITY)
Admission: RE | Admit: 2011-09-02 | Discharge: 2011-09-02 | Disposition: A | Discharge status: Home / Self Care | Payer: Medicaid Other | Source: Ambulatory Visit | Attending: Obstetrics and Gynecology | Admitting: Obstetrics and Gynecology

## 2011-09-02 ENCOUNTER — Other Ambulatory Visit: Payer: Self-pay | Admitting: Obstetrics and Gynecology

## 2011-09-02 DIAGNOSIS — Z124 Encounter for screening for malignant neoplasm of cervix: Secondary | ICD-10-CM | POA: Insufficient documentation

## 2011-09-09 ENCOUNTER — Inpatient Hospital Stay (HOSPITAL_COMMUNITY)
Admission: AD | Admit: 2011-09-09 | Discharge: 2011-09-09 | Disposition: A | Discharge status: Home / Self Care | Payer: Medicaid Other | Source: Ambulatory Visit | Attending: Obstetrics | Admitting: Obstetrics

## 2011-09-09 ENCOUNTER — Encounter (HOSPITAL_COMMUNITY): Payer: Self-pay | Admitting: *Deleted

## 2011-09-09 DIAGNOSIS — N939 Abnormal uterine and vaginal bleeding, unspecified: Secondary | ICD-10-CM

## 2011-09-09 DIAGNOSIS — N949 Unspecified condition associated with female genital organs and menstrual cycle: Secondary | ICD-10-CM | POA: Insufficient documentation

## 2011-09-09 DIAGNOSIS — N938 Other specified abnormal uterine and vaginal bleeding: Secondary | ICD-10-CM | POA: Insufficient documentation

## 2011-09-09 DIAGNOSIS — N898 Other specified noninflammatory disorders of vagina: Secondary | ICD-10-CM

## 2011-09-09 LAB — URINE MICROSCOPIC-ADD ON

## 2011-09-09 LAB — URINALYSIS, ROUTINE W REFLEX MICROSCOPIC
Bilirubin Urine: NEGATIVE
Glucose, UA: NEGATIVE mg/dL
Ketones, ur: NEGATIVE mg/dL
Leukocytes, UA: NEGATIVE
Nitrite: NEGATIVE
Protein, ur: NEGATIVE mg/dL
Specific Gravity, Urine: >1.03 — ABNORMAL HIGH (ref 1.005–1.030)
Urobilinogen, UA: 0.2 mg/dL (ref 0.0–1.0)
pH: 6 (ref 5.0–8.0)

## 2011-09-09 LAB — POCT PREGNANCY, URINE: Preg Test, Ur: NEGATIVE

## 2011-09-09 NOTE — MAU Note (Signed)
Pt reports she had normal period on 08/24/2011, started bleeding again yesterday, now changing a pad q 1 hour. S/p vaginal delivery  06/19/2011

## 2011-09-09 NOTE — MAU Provider Note (Signed)
April Blanchard [id number] Chief Complaint  Patient presents with  . Vaginal Bleeding     First Provider Initiated Contact with Patient 09/09/11 0243      SUBJECTIVE  HPI: Concerned she is bleeding and may be pregnant.  Had SVD 06/19/11 and was given Sprintec at her 6 wk check. Had menses 7/29-8/3. Took the OCPs 8/6, 8/7 and 8/8. Stopped because she believed they caused her to have a headache. Began bleeding 8/17 and is still bleeding like menstrual period.  Has plans to get Mirena. Last intercourse 8/ 16/13.  Past Medical History  Diagnosis Date  . Asthma     albuterol i month ago  . Anemia   . Palpitations    Past Surgical History  Procedure Date  . Tonsillectomy    History   Social History  . Marital Status: Married    Spouse Name: N/A    Number of Children: N/A  . Years of Education: N/A   Occupational History  . Not on file.   Social History Main Topics  . Smoking status: Never Smoker   . Smokeless tobacco: Never Used  . Alcohol Use: No  . Drug Use: No  . Sexually Active: Yes    Birth Control/ Protection: Pill   Other Topics Concern  . Not on file   Social History Narrative  . No narrative on file   No current facility-administered medications on file prior to encounter.   Current Outpatient Prescriptions on File Prior to Encounter  Medication Sig Dispense Refill  . norgestimate-ethinyl estradiol (ORTHO-CYCLEN,SPRINTEC,PREVIFEM) 0.25-35 MG-MCG tablet Take 1 tablet by mouth daily.      . ferrous sulfate 325 (65 FE) MG tablet Take 1 tablet (325 mg total) by mouth daily with breakfast.  30 tablet  6   No Known Allergies  ROS: Pertinent items in HPI  OBJECTIVE Blood pressure 113/61, pulse 88, temperature 97.8 F (36.6 C), temperature source Oral, resp. rate 18, height 5' 1.5" (1.562 m), weight 74.844 kg (165 lb), last menstrual period 08/19/2011, SpO2 100.00%, unknown if currently breastfeeding.  GENERAL: Well-developed, well-nourished female in no acute  distress.  HEENT: Normocephalic, good dentition HEART: normal rate RESP: normal effort ABDOMEN: Soft, nontender EXTREMITIES: Nontender, no edema NEURO: Alert and oriented   LAB RESULTS  Results for orders placed during the hospital encounter of 09/09/11 (from the past 24 hour(s))  URINALYSIS, ROUTINE W REFLEX MICROSCOPIC     Status: Abnormal   Collection Time   09/09/11  1:57 AM      Component Value Range   Color, Urine YELLOW  YELLOW   APPearance CLOUDY (*) CLEAR   Specific Gravity, Urine >1.030 (*) 1.005 - 1.030   pH 6.0  5.0 - 8.0   Glucose, UA NEGATIVE  NEGATIVE mg/dL   Hgb urine dipstick LARGE (*) NEGATIVE   Bilirubin Urine NEGATIVE  NEGATIVE   Ketones, ur NEGATIVE  NEGATIVE mg/dL   Protein, ur NEGATIVE  NEGATIVE mg/dL   Urobilinogen, UA 0.2  0.0 - 1.0 mg/dL   Nitrite NEGATIVE  NEGATIVE   Leukocytes, UA NEGATIVE  NEGATIVE  URINE MICROSCOPIC-ADD ON     Status: Normal   Collection Time   09/09/11  1:57 AM      Component Value Range   Squamous Epithelial / LPF RARE  RARE   WBC, UA 0-2  <3 WBC/hpf   RBC / HPF TOO NUMEROUS TO COUNT  <3 RBC/hpf  POCT PREGNANCY, URINE     Status: Normal   Collection Time  09/09/11  2:10 AM      Component Value Range   Preg Test, Ur NEGATIVE  NEGATIVE       ASSESSMENT  1. Vaginal bleeding, abnormal   Bleeding secondary to oral contraceptives  PLAN Reassured she is not pregnant Medication List  As of 09/09/2011  2:55 AM   ASK your doctor about these medications         ferrous sulfate 325 (65 FE) MG tablet   Take 1 tablet (325 mg total) by mouth daily with breakfast.      norgestimate-ethinyl estradiol 0.25-35 MG-MCG tablet   Commonly known as: ORTHO-CYCLEN,SPRINTEC,PREVIFEM   Take 1 tablet by mouth daily.           Advised no intercourse for 2 weeks and call Dr. Elsie Stain office for insertion appointment then.      April Blanchard 09/09/2011 2:55 AM

## 2011-09-09 NOTE — MAU Note (Signed)
Pt report that she had a period on July 29. Pt reports that her bleeding stopped on August 4. Pt reports that she did not understand how to take her birthcontrol pills. She took 3 days of pills when her cycle ended. Now pt states that she is having bleeding again which started on 8/17.

## 2012-08-08 IMAGING — US US OB DETAIL+14 WK
1 series · 12 of 28 positions shown · non-contrast
Comparison: none

[Series 1: us ob detail +14 wk · 12 of 103 slices shown]
[im 4/103]
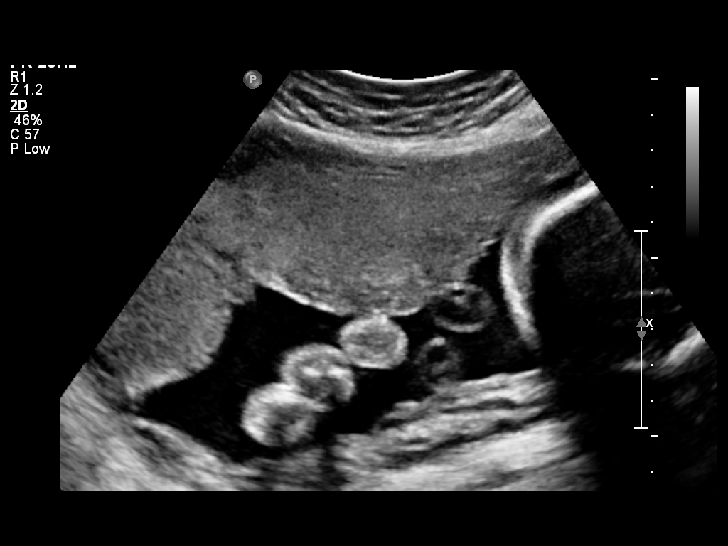
[im 12/103]
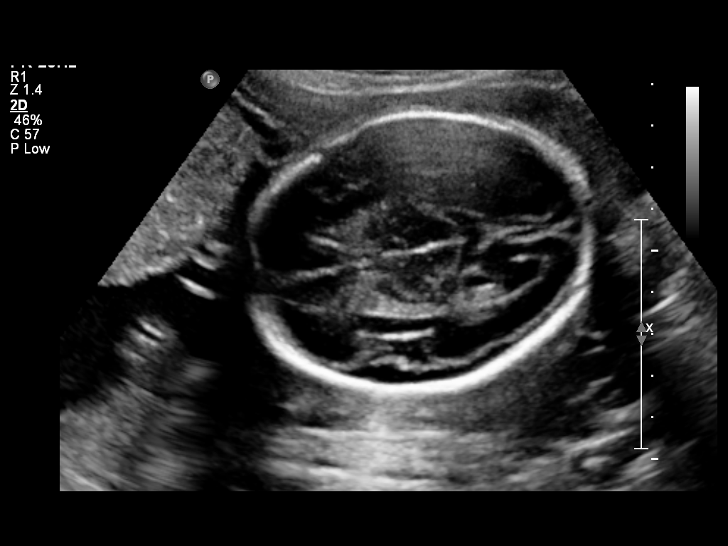
[im 19/103]
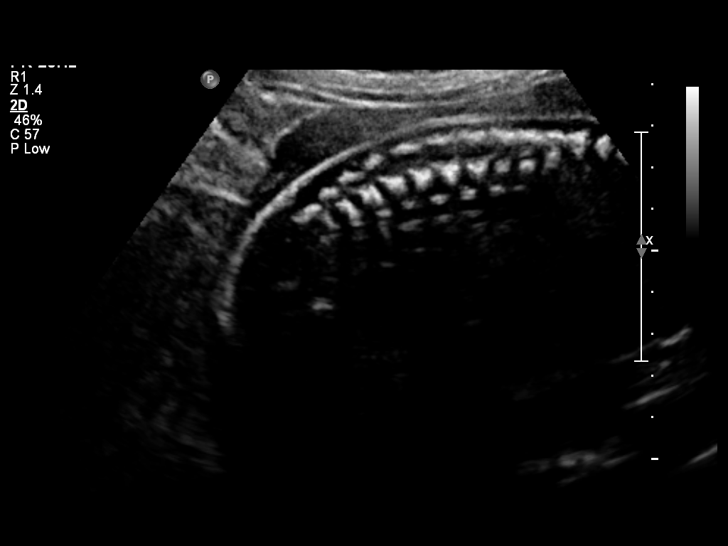
[im 31/103]
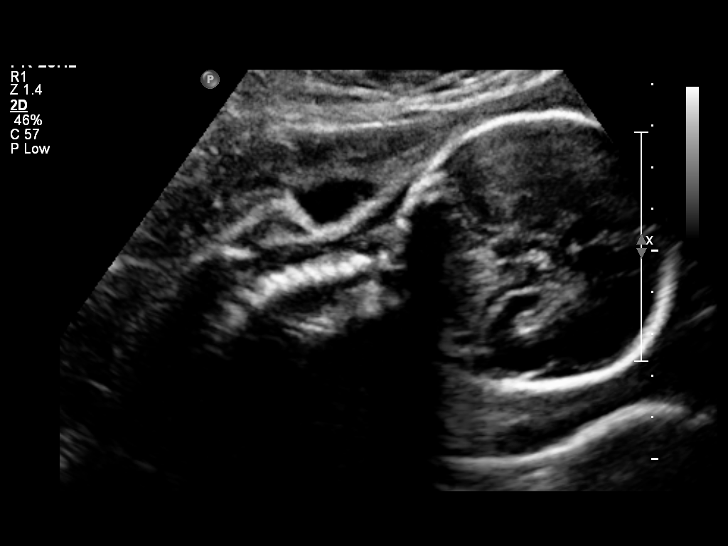
[im 38/103]
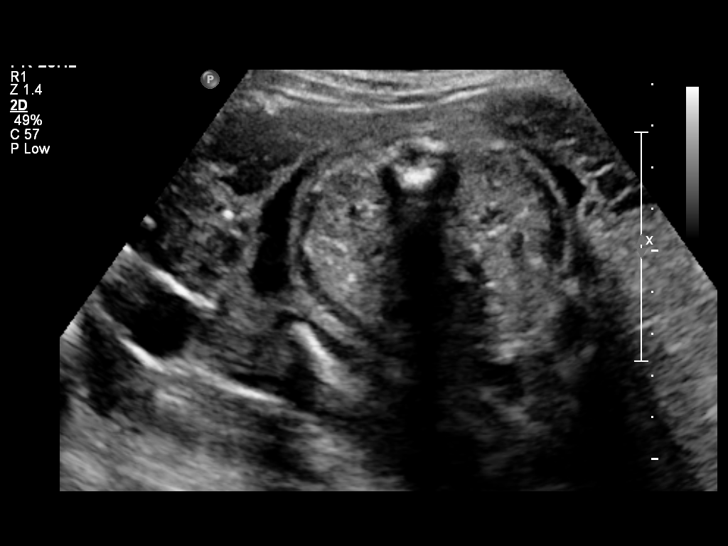
[im 46/103]
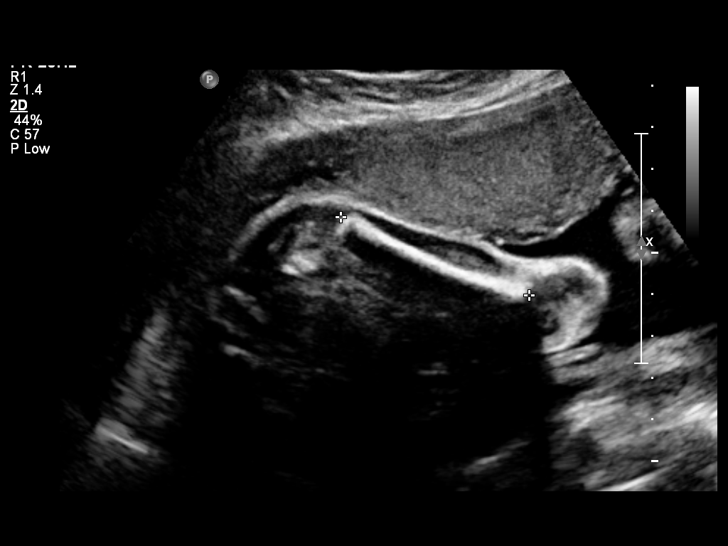
[im 57/103]
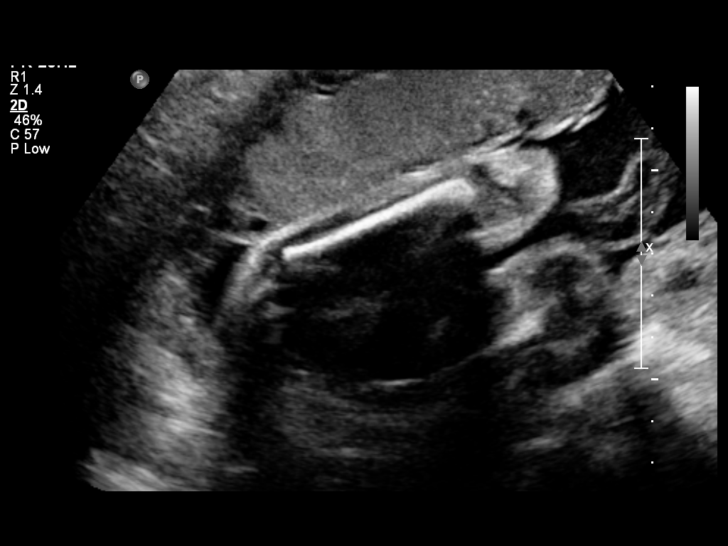
[im 65/103]
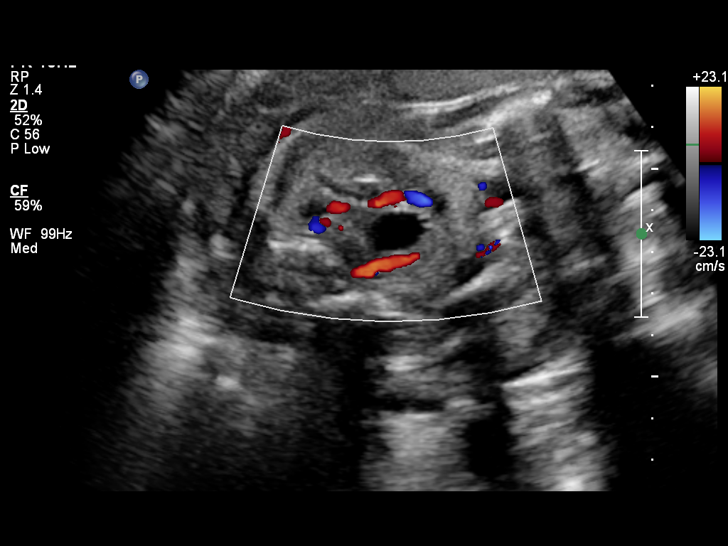
[im 72/103]
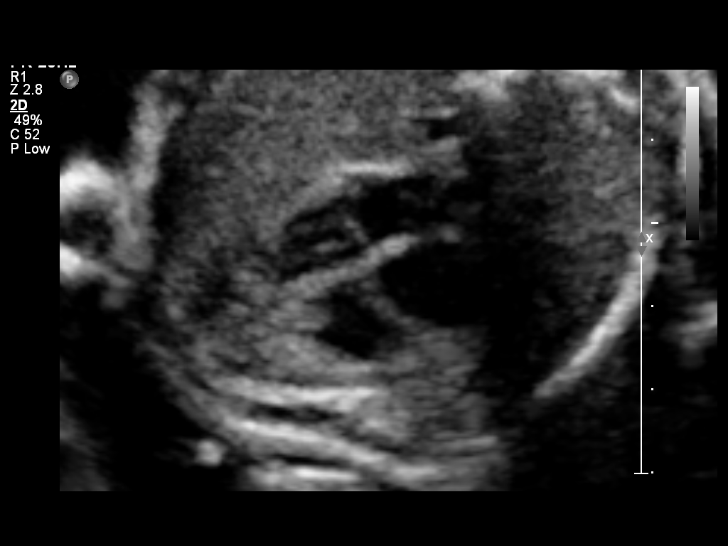
[im 84/103]
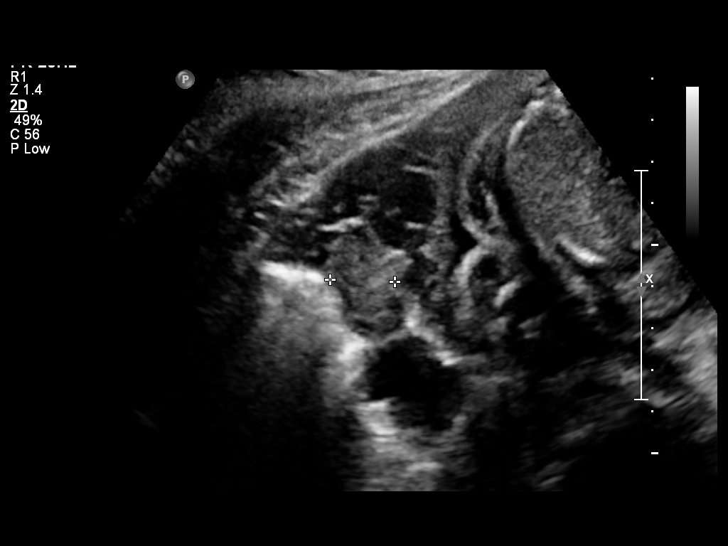
[im 91/103]
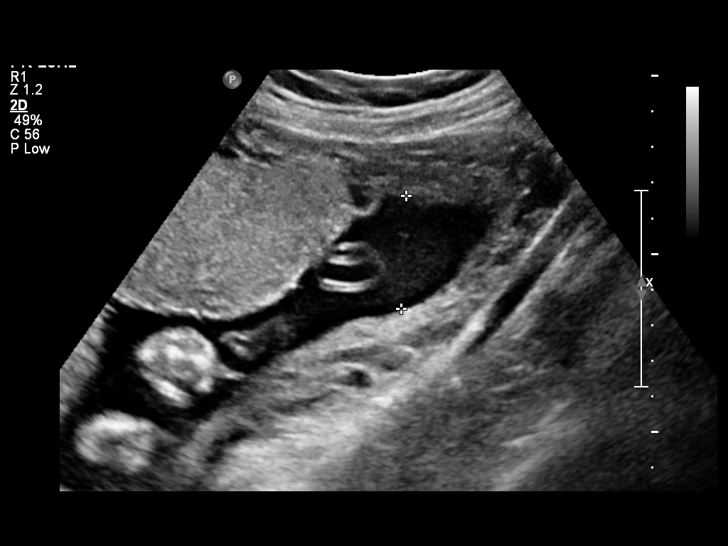
[im 99/103]
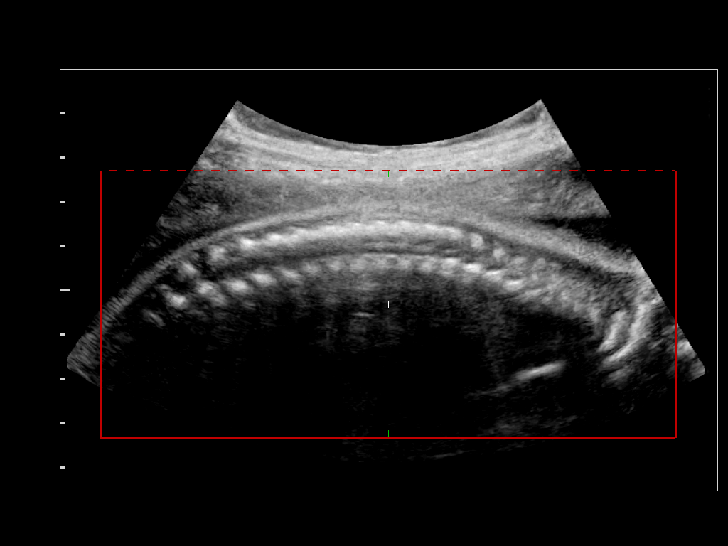

[12 of 28 positions shown; findings below may reference images not displayed]

OBSTETRICS REPORT
                      (Signed Final 03/26/2011 [DATE])

 Order#:         [id number]
Procedures

 US OB DETAIL + 14 WK                                  76811.0
Indications

 Detailed fetal anatomic survey
Fetal Evaluation

 Fetal Heart Rate:  150                         bpm
 Cardiac Activity:  Observed
 Presentation:      Cephalic
 Placenta:          Anterior left, above
                    cervical os
 P. Cord            Visualized
 Insertion:

 Amniotic Fluid
 AFI FV:      Subjectively within normal limits
 AFI Sum:     10.53   cm      14   %Tile     Larg Pckt:   3.16   cm
 RUQ:   3.15   cm    RLQ:    1.56   cm    LUQ:   3.16    cm   LLQ:    2.66   cm
Biometry

 BPD:     64.7  mm    G. Age:   26w 1d                CI:        73.07   70 - 86
                                                      FL/HC:      20.0   18.6 -

 HC:     240.6  mm    G. Age:   26w 1d        3  %    HC/AC:      1.12   1.05 -

 AC:     214.3  mm    G. Age:   26w 0d       10  %    FL/BPD:     74.5   71 - 87
 FL:      48.2  mm    G. Age:   26w 1d       10  %    FL/AC:      22.5   20 - 24
 HUM:     44.8  mm    G. Age:   26w 4d       30  %
 CER:     28.4  mm    G. Age:   25w 3d       15  %
 Est. FW:     885  gm    1 lb 15 oz      26  %
Gestational Age

 Clinical EDD:  27w 2d                                        EDD:   06/23/11
 U/S Today:     26w 1d                                        EDD:   07/01/11
 Best:          27w 2d    Det. By:   Clinical EDD             EDD:   06/23/11
Genetic Sonogram - Trisomy 21 Screening
 Age:                                             25          Risk=1:   885
 Echogenic bowel:                                 No          LR :
 Hypoplastic/absent Nasal bone:                   No
 Choroid plexus cysts:                            No
 Structural anomalies (inc. cardiac):             No          LR :
 Hypoplastic / absent midphalanx 5th Digit:       No
 Short femur:                                     No          LR :
 Wide space 3st-4nd toes:                         No
 Short humerus:                                   No          LR :
 2-vessel umbilical cord:                         No
 Pyelectasis:                                     No          LR :
 Echogenic cardiac foci:                          No          LR :

 11 Of 11 Criteria Were Visualized and 0 Abnormal(s) Were Seen.
 Ultrasound Modified Risk for Fetal Down Syndrome = [DATE]
Anatomy

 Cranium:           Appears normal      Aortic Arch:       Appears normal
 Fetal Cavum:       Appears normal      Ductal Arch:       Appears normal
 Ventricles:        Appears normal      Diaphragm:         Appears normal
 Choroid Plexus:    Appears normal      Stomach:           Appears normal
 Cerebellum:        Appears normal      Abdomen:           Appears normal
 Posterior Fossa:   Appears normal      Abdominal Wall:    Appears nml
                                                           (cord insert,
                                                           abd wall)
 Nuchal Fold:       Not applicable      Cord Vessels:      Appears normal
                    (>20 wks GA)                           (3 vessel cord)
 Face:              Appears normal      Kidneys:           Appear normal
                    (lips/profile/orbit
                    s)
 Heart:             Appears normal      Bladder:           Appears normal
                    (4 chamber &
                    axis)
 RVOT:              Appears normal      Spine:             Appears normal
 LVOT:              Appears normal      Limbs:             Appears normal
                                                           (hands, ankles,
                                                           feet)

 Other:     Nasal bone visualized. Heels visualized. Fetus appears
            to be a female.
Cervix Uterus Adnexa

 Cervical Length:   3.8       cm

 Cervix:       Normal appearance by transabdominal scan.

 Left Ovary:   Within normal limits.
 Right Ovary:  Within normal limits.
Impression

 Single living intrauterine gestation with concordant fetal
 indices and normal visualized anatomy.  Today's sonographic
 EGA is 1w1d younger than the clinical EGA.  No sonographic
 markers for aneuploidy visualized;  see above for US
 modified Trisomy 21 risk calculation.
 questions or concerns.

## 2013-11-22 ENCOUNTER — Encounter (HOSPITAL_COMMUNITY): Payer: Self-pay | Admitting: *Deleted

## 2016-02-27 ENCOUNTER — Telehealth: Payer: Self-pay | Admitting: Behavioral Health

## 2016-02-27 NOTE — Telephone Encounter (Signed)
Patient declined to complete Pre-Visit Info at this time. Per patient, she will update all information at her visit on 02/28/16 at 10:30 AM with Dr. Lorelei Pont.

## 2016-02-28 ENCOUNTER — Ambulatory Visit: Payer: Self-pay | Admitting: Family Medicine

## 2016-02-29 ENCOUNTER — Ambulatory Visit (INDEPENDENT_AMBULATORY_CARE_PROVIDER_SITE_OTHER): Payer: Medicaid Other | Admitting: Allergy and Immunology

## 2016-02-29 ENCOUNTER — Encounter: Payer: Self-pay | Admitting: Allergy and Immunology

## 2016-02-29 VITALS — BP 104/74 | HR 88 | Temp 98.3°F | Resp 16 | Ht 62.5 in | Wt 155.6 lb

## 2016-02-29 DIAGNOSIS — H1013 Acute atopic conjunctivitis, bilateral: Secondary | ICD-10-CM | POA: Diagnosis not present

## 2016-02-29 DIAGNOSIS — L500 Allergic urticaria: Secondary | ICD-10-CM | POA: Diagnosis not present

## 2016-02-29 DIAGNOSIS — J3089 Other allergic rhinitis: Secondary | ICD-10-CM | POA: Diagnosis not present

## 2016-02-29 DIAGNOSIS — J4541 Moderate persistent asthma with (acute) exacerbation: Secondary | ICD-10-CM

## 2016-02-29 DIAGNOSIS — J4540 Moderate persistent asthma, uncomplicated: Secondary | ICD-10-CM | POA: Insufficient documentation

## 2016-02-29 DIAGNOSIS — H1010 Acute atopic conjunctivitis, unspecified eye: Secondary | ICD-10-CM | POA: Insufficient documentation

## 2016-02-29 MED ORDER — OLOPATADINE HCL 0.1 % OP SOLN: 1.0000 [drp] | Freq: Every day | OPHTHALMIC | 5 refills | Status: AC | PRN

## 2016-02-29 MED ORDER — MONTELUKAST SODIUM 10 MG PO TABS: 10.0000 mg | ORAL_TABLET | Freq: Every day | ORAL | 5 refills | Status: AC

## 2016-02-29 MED ORDER — LEVOCETIRIZINE DIHYDROCHLORIDE 5 MG PO TABS: 5.0000 mg | ORAL_TABLET | Freq: Every evening | ORAL | 5 refills | Status: DC

## 2016-02-29 MED ORDER — FLUTICASONE PROPIONATE 50 MCG/ACT NA SUSP: 2.0000 | Freq: Every day | NASAL | 5 refills | Status: AC

## 2016-02-29 MED ORDER — BUDESONIDE-FORMOTEROL FUMARATE 160-4.5 MCG/ACT IN AERO: 2.0000 | INHALATION_SPRAY | Freq: Two times a day (BID) | RESPIRATORY_TRACT | 5 refills | Status: DC

## 2016-02-29 NOTE — Assessment & Plan Note (Addendum)
   Treatment plan as outlined above for allergic rhinitis.  A prescription has been provided for Patanol, one drop per eye twice daily as needed. 

## 2016-02-29 NOTE — Patient Instructions (Addendum)
Moderate persistent asthma  A prescription has been provided for Symbicort (budesonide/formoterol) 160/4.5 g,  2 inhalations twice a day. To maximize pulmonary deposition, a spacer has been provided along with instructions for its proper administration with an HFA inhaler.  A prescription has been provided for montelukast 10 mg daily at bedtime.  Continue albuterol HFA, 1-2 inhalations every 4-6 hours as needed.  I have also encouraged the use of albuterol 15 minutes prior to exercise.  If her symptoms are not adequately controlled with the treatment plan as outlined above, she is a good candidate for benralizumab given her elevated eosinophil count.  The patient has been asked to contact me if her symptoms persist or progress. Otherwise, she may return for follow up in 6 weeks.  Perennial and seasonal allergic rhinitis  Aeroallergen avoidance measures have been discussed and provided in written form.  Montelukast has been prescribed (as above).  A prescription has been provided for levocetirizine, 5mg  daily as needed.  A prescription has been provided for fluticasone nasal spray, 2 sprays per nostril daily as needed. Proper nasal spray technique has been discussed and demonstrated.  I have also recommended nasal saline spray (i.e., Simply Saline) or nasal saline lavage (i.e., NeilMed) as needed prior to medicated nasal sprays.  If allergen avoidance measures and medications fail to adequately relieve symptoms, aeroallergen immunotherapy will be considered.  Allergic conjunctivitis  Treatment plan as outlined above for allergic rhinitis.  A prescription has been provided for Patanol, one drop per eye twice daily as needed.  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 Skin tests to select food allergens were negative today. Physical urticarias are negative by history (i.e. pressure-induced, temperature,  vibration, solar, etc.).  We will not order labs at this time, however, if lesions recur, persist, progress, or change in character in the absence of pollen exposure, we will assess potential etiologies with screening labs.  For symptom relief, patient is to take oral antihistamines as directed.  A prescription has been provided for levocetirizine (as above).   A prescription has been provided for montelukast (as above).    Should symptoms recur in the absence of pollen exposure, a journal is to be kept recording any foods eaten, beverages consumed, medications taken within a 6 hour period prior to the onset of symptoms, as well as record activities being performed, and environmental conditions. For any symptoms concerning for anaphylaxis, 911 is to be called immediately.   Return in about 6 weeks (around 04/11/2016), or if symptoms worsen or fail to improve.  Reducing Pollen Exposure  The American Academy of Allergy, Asthma and Immunology suggests the following steps to reduce your exposure to pollen during allergy seasons.    1. Do not hang sheets or clothing out to dry; pollen may collect on these items. 2. Do not mow lawns or spend time around freshly cut grass; mowing stirs up pollen. 3. Keep windows closed at night.  Keep car windows closed while driving. 4. Minimize morning activities outdoors, a time when pollen counts are usually at their highest. 5. Stay indoors as much as possible when pollen counts or humidity is high and on windy days when pollen tends to remain in the air longer. 6. Use air conditioning when possible.  Many air conditioners have filters that trap the pollen spores. 7. Use a HEPA room air filter to remove pollen form the indoor air you breathe.   Control of House Dust Mite Allergen  House dust mites play a major role in allergic asthma and rhinitis.  They occur in environments with high humidity wherever human skin, the food for dust mites is found. High  levels have been detected in dust obtained from mattresses, pillows, carpets, upholstered furniture, bed covers, clothes and soft toys.  The principal allergen of the house dust mite is found in its feces.  A gram of dust may contain 1,000 mites and 250,000 fecal particles.  Mite antigen is easily measured in the air during house cleaning activities.    1. Encase mattresses, including the box spring, and pillow, in an air tight cover.  Seal the zipper end of the encased mattresses with wide adhesive tape. 2. Wash the bedding in water of 130 degrees Farenheit weekly.  Avoid cotton comforters/quilts and flannel bedding: the most ideal bed covering is the dacron comforter. 3. Remove all upholstered furniture from the bedroom. 4. Remove carpets, carpet padding, rugs, and non-washable window drapes from the bedroom.  Wash drapes weekly or use plastic window coverings. 5. Remove all non-washable stuffed toys from the bedroom.  Wash stuffed toys weekly. 6. Have the room cleaned frequently with a vacuum cleaner and a damp dust-mop.  The patient should not be in a room which is being cleaned and should wait 1 hour after cleaning before going into the room. 7. Close and seal all heating outlets in the bedroom.  Otherwise, the room will become filled with dust-laden air.  An electric heater can be used to heat the room. Reduce indoor humidity to less than 50%.  Do not use a humidifier.  Control of Dog or Cat Allergen  Avoidance is the best way to manage a dog or cat allergy. If you have a dog or cat and are allergic to dog or cats, consider removing the dog or cat from the home. If you have a dog or cat but don't want to find it a new home, or if your family wants a pet even though someone in the household is allergic, here are some strategies that may help keep symptoms at bay:  1. Keep the pet out of your bedroom and restrict it to only a few rooms. Be advised that keeping the dog or cat in only one room will  not limit the allergens to that room. 2. Don't pet, hug or kiss the dog or cat; if you do, wash your hands with soap and water. 3. High-efficiency particulate air (HEPA) cleaners run continuously in a bedroom or living room can reduce allergen levels over time. 4. Regular use of a high-efficiency vacuum cleaner or a central vacuum can reduce allergen levels. 5. Giving your dog or cat a bath at least once a week can reduce airborne allergen.  Control of Mold Allergen  Mold and fungi can grow on a variety of surfaces provided certain temperature and moisture conditions exist.  Outdoor molds grow on plants, decaying vegetation and soil.  The major outdoor mold, Alternaria and Cladosporium, are found in very high numbers during hot and dry conditions.  Generally, a late Summer - Fall peak is seen for common outdoor fungal spores.  Rain will temporarily lower outdoor mold spore count, but counts rise rapidly when the rainy period ends.  The most important indoor molds are Aspergillus and Penicillium.  Dark, humid and poorly ventilated basements are ideal sites for mold growth.  The next most common sites of mold growth are the bathroom and the kitchen.  Outdoor [employer] 1. Use  air conditioning and keep windows closed 2. Avoid exposure to decaying vegetation. 3. Avoid leaf raking. 4. Avoid grain handling. 5. Consider wearing a face mask if working in moldy areas.  Indoor Mold Control 1. Maintain humidity below 50%. 2. Clean washable surfaces with 5% bleach solution. 3. Remove sources e.g. Contaminated carpets.

## 2016-02-29 NOTE — Assessment & Plan Note (Addendum)
   A prescription has been provided for Symbicort (budesonide/formoterol) 160/4.5 g, 2 inhalations twice a day. To maximize pulmonary deposition, a spacer has been provided along with instructions for its proper administration with an HFA inhaler.  A prescription has been provided for montelukast 10 mg daily at bedtime.  Continue albuterol HFA, 1-2 inhalations every 4-6 hours as needed.  I have also encouraged the use of albuterol 15 minutes prior to exercise.  If her symptoms are not adequately controlled with the treatment plan as outlined above, she is a good candidate for benralizumab given her elevated eosinophil count.  The patient has been asked to contact me if her symptoms persist or progress. Otherwise, she may return for follow up in 6 weeks.

## 2016-02-29 NOTE — Progress Notes (Signed)
New Patient Note  RE: April Blanchard MRN: [id number] DOB: 04-Jul-1985 Date of Office Visit: 02/29/2016  Referring provider: Antonietta Jewel, MD Primary care provider: Antonietta Jewel, MD  Chief Complaint: Asthma; Nasal Congestion; Urticaria; and Pruritus   History of present illness: April Blanchard is a 31 y.o. female seen today in consultation requested by Antonietta Jewel, MD.  She reports that she experiences asthma symptoms on a daily basis and experiences nocturnal awakenings due to lower respiratory symptoms every night of the week.  She has required 2 courses of prednisone over the past 12 months.  She carries an albuterol rescue inhaler, however states that she is currently is not on an asthma controller medication.  Her asthma is triggered by mild/moderate exertion as well as symptoms associated with allergic rhinitis such as runny nose, sneezing, postnasal drainage, nasal pruritus, and ocular pruritus. She also experiences hives on her face, typically associated with her other nasal/ocular symptom.  No significant seasonal symptom variation has been noted nor have specific environmental triggers been identified which trigger her nasal, ocular, or cutaneous symptoms.  Over-the-counter antihistamines have failed to provide adequate symptom relief.     Assessment and plan: Moderate persistent asthma  A prescription has been provided for Symbicort (budesonide/formoterol) 160/4.5 g,  2 inhalations twice a day. To maximize pulmonary deposition, a spacer has been provided along with instructions for its proper administration with an HFA inhaler.  A prescription has been provided for montelukast 10 mg daily at bedtime.  Continue albuterol HFA, 1-2 inhalations every 4-6 hours as needed.  I have also encouraged the use of albuterol 15 minutes prior to exercise.  If her symptoms are not adequately controlled with the treatment plan as outlined above, she is a good candidate for benralizumab given her elevated  eosinophil count.  The patient has been asked to contact me if her symptoms persist or progress. Otherwise, she may return for follow up in 6 weeks.  Perennial and seasonal allergic rhinitis  Aeroallergen avoidance measures have been discussed and provided in written form.  Montelukast has been prescribed (as above).  A prescription has been provided for levocetirizine, 5mg  daily as needed.  A prescription has been provided for fluticasone nasal spray, 2 sprays per nostril daily as needed. Proper nasal spray technique has been discussed and demonstrated.  I have also recommended nasal saline spray (i.e., Simply Saline) or nasal saline lavage (i.e., NeilMed) as needed prior to medicated nasal sprays.  If allergen avoidance measures and medications fail to adequately relieve symptoms, aeroallergen immunotherapy will be considered.  Allergic conjunctivitis  Treatment plan as outlined above for allergic rhinitis.  A prescription has been provided for Patanol, one drop per eye twice daily as needed.  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 Skin tests to select food allergens were negative today. Physical urticarias are negative by history (i.e. pressure-induced, temperature, vibration, solar, etc.).  We will not order labs at this time, however, if lesions recur, persist, progress, or change in character in the absence of pollen exposure, we will assess potential etiologies with screening labs.  For symptom relief, patient is to take oral antihistamines as directed.  A prescription has been provided for levocetirizine (as above).   A prescription has been provided for montelukast (as above).    Should symptoms recur in the absence of pollen exposure, a journal is to be kept recording any foods eaten, beverages consumed, medications taken within a 6 hour period  prior to the onset of symptoms, as well as  record activities being performed, and environmental conditions. For any symptoms concerning for anaphylaxis, 911 is to be called immediately.   Meds ordered this encounter  Medications  . budesonide-formoterol (SYMBICORT) 160-4.5 MCG/ACT inhaler    Sig: Inhale 2 puffs into the lungs 2 (two) times daily.    Dispense:  1 Inhaler    Refill:  5  . levocetirizine (XYZAL) 5 MG tablet    Sig: Take 1 tablet (5 mg total) by mouth every evening.    Dispense:  30 tablet    Refill:  5  . montelukast (SINGULAIR) 10 MG tablet    Sig: Take 1 tablet (10 mg total) by mouth at bedtime.    Dispense:  30 tablet    Refill:  5  . fluticasone (FLONASE) 50 MCG/ACT nasal spray    Sig: Place 2 sprays into both nostrils daily.    Dispense:  16 g    Refill:  5  . olopatadine (PATANOL) 0.1 % ophthalmic solution    Sig: Place 1 drop into both eyes daily as needed for allergies.    Dispense:  5 mL    Refill:  5    Diagnostics: Spirometry: FVC is 2.91 L and FEV1 is 2.38 L (79% predicted) with significant (240 mL) post bronchodilator improvement.This study was performed while the patient was asymptomatic.  Please see scanned spirometry results for details. Epicutaneous testing: Positive to cat hair and dust mite antigen. Intradermal testing: Positive to molds, and grass pollen.    Physical examination: Blood pressure 104/74, pulse 88, temperature 98.3 F (36.8 C), temperature source Oral, resp. rate 16, height 5' 2.5" (1.588 m), weight 155 lb 9.6 oz (70.6 kg), unknown if currently breastfeeding.  General: Alert, interactive, in no acute distress. HEENT: TMs pearly gray, turbinates moderately edematous with thick discharge, post-pharynx mildly erythematous. Neck: Supple without lymphadenopathy. Lungs: Clear to auscultation without wheezing, rhonchi or rales. CV: Normal S1, S2 without murmurs. Abdomen: Nondistended, nontender. Skin: Warm and dry, without lesions or rashes. Extremities:  No clubbing,  cyanosis or edema. Neuro:   Grossly intact.  Review of systems:  Review of systems negative except as noted in HPI / PMHx or noted below: Review of Systems  Constitutional: Negative.   HENT: Negative.   Eyes: Negative.   Respiratory: Negative.   Cardiovascular: Negative.   Gastrointestinal: Negative.   Genitourinary: Negative.   Musculoskeletal: Negative.   Skin: Negative.   Neurological: Negative.   Endo/Heme/Allergies: Negative.   Psychiatric/Behavioral: Negative.     Past medical history:  Past Medical History:  Diagnosis Date  . Anemia   . Asthma    albuterol i month ago  . Palpitations     Past surgical history:  Past Surgical History:  Procedure Laterality Date  . TONSILLECTOMY      Family history: Family History  Problem Relation Age of Onset  . Diabetes Mother   . Asthma Mother   . Asthma Brother   . Asthma Maternal Uncle   . Diabetes Maternal Grandmother   . Asthma Father   . Other Neg Hx     Social history: Social History   Social History  . Marital status: Married    Spouse name: N/A  . Number of children: N/A  . Years of education: N/A   Occupational History  . Not on file.   Social History Main Topics  . Smoking status: Never Smoker  . Smokeless tobacco: Never Used  . Alcohol  use No  . Drug use: No  . Sexual activity: Yes    Birth control/ protection: Pill   Other Topics Concern  . Not on file   Social History Narrative  . No narrative on file   Environmental History: The patient lives in an apartment with hardwood floors throughout and central air/heat.  There is no known water damage or mold issue in the apartment.  She is a nonsmoker without cats or dogs.  Allergies as of 02/29/2016   No Known Allergies     Medication List       Accurate as of 02/29/16 12:40 PM. Always use your most recent med list.          budesonide-formoterol 160-4.5 MCG/ACT inhaler Commonly known as:  SYMBICORT Inhale 2 puffs into the lungs 2  (two) times daily.   cetirizine 10 MG tablet Commonly known as:  ZYRTEC Take 10 mg by mouth daily.   fluticasone 50 MCG/ACT nasal spray Commonly known as:  FLONASE Place 2 sprays into both nostrils daily.   levocetirizine 5 MG tablet Commonly known as:  XYZAL Take 1 tablet (5 mg total) by mouth every evening.   montelukast 10 MG tablet Commonly known as:  SINGULAIR Take 1 tablet (10 mg total) by mouth at bedtime.   olopatadine 0.1 % ophthalmic solution Commonly known as:  PATANOL Place 1 drop into both eyes daily as needed for allergies.   PROAIR HFA 108 (90 Base) MCG/ACT inhaler Generic drug:  albuterol Inhale 2 puffs into the lungs every 6 (six) hours as needed for wheezing or shortness of breath.       Known medication allergies: No Known Allergies  I appreciate the opportunity to take part in April Blanchard's care. Please do not hesitate to contact me with questions.  Sincerely,   R. Edgar Frisk, MD

## 2016-02-29 NOTE — Assessment & Plan Note (Signed)
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 Skin tests to select food allergens were negative today. Physical urticarias are negative by history (i.e. pressure-induced, temperature, vibration, solar, etc.).  We will not order labs at this time, however, if lesions recur, persist, progress, or change in character in the absence of pollen exposure, we will assess potential etiologies with screening labs.  For symptom relief, patient is to take oral antihistamines as directed.  A prescription has been provided for levocetirizine (as above).   A prescription has been provided for montelukast (as above).    Should symptoms recur in the absence of pollen exposure, a journal is to be kept recording any foods eaten, beverages consumed, medications taken within a 6 hour period prior to the onset of symptoms, as well as record activities being performed, and environmental conditions. For any symptoms concerning for anaphylaxis, 911 is to be called immediately.

## 2016-02-29 NOTE — Assessment & Plan Note (Addendum)
   Aeroallergen avoidance measures have been discussed and provided in written form.  Montelukast has been prescribed (as above).  A prescription has been provided for levocetirizine, 5mg  daily as needed.  A prescription has been provided for fluticasone nasal spray, 2 sprays per nostril daily as needed. Proper nasal spray technique has been discussed and demonstrated.  I have also recommended nasal saline spray (i.e., Simply Saline) or nasal saline lavage (i.e., NeilMed) as needed prior to medicated nasal sprays.  If allergen avoidance measures and medications fail to adequately relieve symptoms, aeroallergen immunotherapy will be considered.

## 2016-03-05 ENCOUNTER — Telehealth: Payer: Self-pay | Admitting: Allergy

## 2016-03-06 NOTE — Telephone Encounter (Signed)
na

## 2016-04-11 ENCOUNTER — Ambulatory Visit: Payer: Medicaid Other | Admitting: Allergy and Immunology

## 2016-07-01 ENCOUNTER — Ambulatory Visit (HOSPITAL_BASED_OUTPATIENT_CLINIC_OR_DEPARTMENT_OTHER)
Admission: RE | Admit: 2016-07-01 | Discharge: 2016-07-01 | Disposition: A | Discharge status: Home / Self Care | Payer: BLUE CROSS/BLUE SHIELD | Source: Ambulatory Visit | Attending: Obstetrics & Gynecology | Admitting: Obstetrics & Gynecology

## 2016-07-01 ENCOUNTER — Ambulatory Visit (INDEPENDENT_AMBULATORY_CARE_PROVIDER_SITE_OTHER): Payer: BLUE CROSS/BLUE SHIELD | Admitting: Obstetrics & Gynecology

## 2016-07-01 ENCOUNTER — Encounter: Payer: Self-pay | Admitting: Obstetrics & Gynecology

## 2016-07-01 VITALS — BP 96/58 | Ht 64.0 in | Wt 160.0 lb

## 2016-07-01 DIAGNOSIS — Z3202 Encounter for pregnancy test, result negative: Secondary | ICD-10-CM | POA: Diagnosis not present

## 2016-07-01 DIAGNOSIS — N941 Unspecified dyspareunia: Secondary | ICD-10-CM

## 2016-07-01 DIAGNOSIS — Z975 Presence of (intrauterine) contraceptive device: Secondary | ICD-10-CM | POA: Insufficient documentation

## 2016-07-01 DIAGNOSIS — Z30433 Encounter for removal and reinsertion of intrauterine contraceptive device: Secondary | ICD-10-CM

## 2016-07-01 DIAGNOSIS — Z01812 Encounter for preprocedural laboratory examination: Secondary | ICD-10-CM

## 2016-07-01 DIAGNOSIS — N898 Other specified noninflammatory disorders of vagina: Secondary | ICD-10-CM | POA: Diagnosis not present

## 2016-07-01 DIAGNOSIS — Z3046 Encounter for surveillance of implantable subdermal contraceptive: Secondary | ICD-10-CM

## 2016-07-01 LAB — POCT URINE PREGNANCY: Preg Test, Ur: NEGATIVE

## 2016-07-01 MED ORDER — LEVONORGESTREL 20 MCG/24HR IU IUD
INTRAUTERINE_SYSTEM | Freq: Once | INTRAUTERINE | Status: AC
  Administered 2016-07-01: 10:00:00 via INTRAUTERINE

## 2016-07-01 NOTE — Addendum Note (Signed)
Addended by: Asencion Islam on: 07/01/2016 10:30 AM   Modules accepted: Orders

## 2016-07-01 NOTE — Addendum Note (Signed)
Addended by: Asencion Islam on: 07/01/2016 10:32 AM   Modules accepted: Orders

## 2016-07-01 NOTE — Addendum Note (Signed)
Addended by: Lyndal Rainbow on: 07/01/2016 10:09 AM   Modules accepted: Orders

## 2016-07-01 NOTE — Progress Notes (Signed)
   Subjective:    Patient ID: April Blanchard, female    DOB: 06-09-1985, 31 y.o.   MRN: [id number]  HPI 31 yo M [place] P4. Her newest husband has 3 children. She does not want more kids but doesn't want a BTL. She also complains of a itching vagina. She has tried an OTC med. She also complains of deep dyspareunia for 3 months. She was seen [facility] the health dept for IUD removal but they could not see the strings and were unable to remove the IUD.   Review of Systems She does not have periods with the Mirena    Objective:   Physical Exam [place], conversing, and ambulating normally UPT negative, consent signed, Time out procedure done. Cervix prepped with betadine and grasped with a single tooth tenaculum. I used uterine dressing forceps to retrieve the intact Mirena easily on the first attempt. I then proceeded to place the new one. Mirena was easily placed and the strings were cut to 3-4 cm. Uterus sounded to 9 cm. She tolerated the procedure well.       Assessment & Plan:  Contraception- Mirena Vaginal itching- wet prep Dyspareunia- order gyn u/s RTC 4 weeks

## 2016-07-02 LAB — CERVICOVAGINAL ANCILLARY ONLY
Bacterial vaginitis: NEGATIVE
Candida vaginitis: NEGATIVE

## 2016-07-17 ENCOUNTER — Telehealth: Payer: Self-pay

## 2016-07-17 NOTE — Telephone Encounter (Signed)
Patient called wanting follow up from 07-01-16. I made her aware that BV and yeast tests were negative. Patient also had ultrasound done. Patient having pelvic pain still and especially with intercourse. Patient advised I will route to Dr. Hulan Fray for plan of care. [name] RNBSN

## 2016-07-29 NOTE — Telephone Encounter (Signed)
Patient made aware that Dr. Hulan Fray reviewed results and made aware that there is nothing to do at this time and if she would like to come in for appointment to discuss further she can. Patient declines appointment at this time. [name] RNBSN

## 2016-08-19 DIAGNOSIS — N83201 Unspecified ovarian cyst, right side: Secondary | ICD-10-CM | POA: Diagnosis not present

## 2016-08-19 DIAGNOSIS — R102 Pelvic and perineal pain: Secondary | ICD-10-CM | POA: Diagnosis not present

## 2016-08-19 DIAGNOSIS — Z6826 Body mass index (BMI) 26.0-26.9, adult: Secondary | ICD-10-CM | POA: Diagnosis not present

## 2016-08-19 DIAGNOSIS — N941 Unspecified dyspareunia: Secondary | ICD-10-CM | POA: Diagnosis not present

## 2016-12-19 ENCOUNTER — Ambulatory Visit (INDEPENDENT_AMBULATORY_CARE_PROVIDER_SITE_OTHER): Payer: BLUE CROSS/BLUE SHIELD | Admitting: Allergy and Immunology

## 2016-12-19 ENCOUNTER — Encounter: Payer: Self-pay | Admitting: Allergy and Immunology

## 2016-12-19 VITALS — BP 96/66 | HR 84 | Temp 98.0°F | Resp 16

## 2016-12-19 DIAGNOSIS — J3089 Other allergic rhinitis: Secondary | ICD-10-CM

## 2016-12-19 DIAGNOSIS — J4540 Moderate persistent asthma, uncomplicated: Secondary | ICD-10-CM | POA: Diagnosis not present

## 2016-12-19 DIAGNOSIS — H1013 Acute atopic conjunctivitis, bilateral: Secondary | ICD-10-CM | POA: Diagnosis not present

## 2016-12-19 DIAGNOSIS — L500 Allergic urticaria: Secondary | ICD-10-CM

## 2016-12-19 DIAGNOSIS — R0681 Apnea, not elsewhere classified: Secondary | ICD-10-CM | POA: Diagnosis not present

## 2016-12-19 MED ORDER — FEXOFENADINE HCL 180 MG PO TABS: 180.0000 mg | ORAL_TABLET | Freq: Every day | ORAL | 3 refills | Status: AC

## 2016-12-19 MED ORDER — EPINEPHRINE 0.3 MG/0.3ML IJ SOAJ: INTRAMUSCULAR | 1 refills | Status: AC

## 2016-12-19 MED ORDER — RANITIDINE HCL 150 MG PO TABS: ORAL_TABLET | ORAL | 5 refills | Status: AC

## 2016-12-19 NOTE — Assessment & Plan Note (Signed)
   Continue Symbicort 160-4.5 g, 2 inhalations via spacer device twice daily, montelukast 10 mg daily bedtime, and albuterol every 4-6 hours as needed.

## 2016-12-19 NOTE — Assessment & Plan Note (Signed)
   The patient's history suggests sleep apnea.  She will be referred to a pulmonologist for polysomnography.

## 2016-12-19 NOTE — Assessment & Plan Note (Addendum)
   Instructions and prescriptions for H1/H2 receptor blockade have been provided (as above).  Continue montelukast 10 mg daily at bedtime.

## 2016-12-19 NOTE — Assessment & Plan Note (Signed)
Currently with suboptimal control.  The risks and benefits of aeroallergen immunotherapy have been discussed. The patient is motivated to initiate immunotherapy to reduce symptoms and decrease medication requirement. Informed consent has been signed and allergen vaccine orders have been submitted. Medications will be decreased or discontinued as symptom relief from immunotherapy becomes evident.  Continue appropriate allergen avoidance measures and montelukast 10 mg daily at bedtime.  For persistent pruritus, instructions have been discussed and provided for H1/H2 receptor blockade with titration to find lowest effective dose.  A prescription has been provided for fexofenadine (Allegra) 180 mg daily as needed.  I recommended starting fluticasone nasal spray, 2 sprays per nostril daily as needed.  Nasal saline spray (i.e., Simply Saline) or nasal saline lavage (i.e., NeilMed) is recommended as needed and prior to medicated nasal sprays.

## 2016-12-19 NOTE — Patient Instructions (Addendum)
Perennial and seasonal allergic rhinitis Currently with suboptimal control.  The risks and benefits of aeroallergen immunotherapy have been discussed. The patient is motivated to initiate immunotherapy to reduce symptoms and decrease medication requirement. Informed consent has been signed and allergen vaccine orders have been submitted. Medications will be decreased or discontinued as symptom relief from immunotherapy becomes evident.  Continue appropriate allergen avoidance measures and montelukast 10 mg daily at bedtime.  For persistent pruritus, instructions have been discussed and provided for H1/H2 receptor blockade with titration to find lowest effective dose.  A prescription has been provided for fexofenadine (Allegra) 180 mg daily as needed.  I recommended starting fluticasone nasal spray, 2 sprays per nostril daily as needed.  Nasal saline spray (i.e., Simply Saline) or nasal saline lavage (i.e., NeilMed) is recommended as needed and prior to medicated nasal sprays.  Moderate persistent asthma  Continue Symbicort 160-4.5 g, 2 inhalations via spacer device twice daily, montelukast 10 mg daily bedtime, and albuterol every 4-6 hours as needed.  Pruritus/urticaria  Instructions and prescriptions for H1/H2 receptor blockade have been provided (as above).  Continue montelukast 10 mg daily at bedtime.  Apnea  The patient's history suggests sleep apnea.  She will be referred to a pulmonologist for polysomnography.   Return in about 4 months (around 04/18/2017), or if symptoms worsen or fail to improve.  Urticaria (Hives)  . Fexofenadine (Allegra) 180 mg once a day.  If symptoms continue then increase to .  Marland Kitchen Fexofenadine (Allegra) 180 mg  twice a day.  If symptoms continue then increase to .  Marland Kitchen Fexofenadine (Allegra) 180 mg  twice a day and Ranitidine (Zantac) 150 mg once a day.  If symptoms continue then increase to.  Marland Kitchen Fexofenadine (Allegra) 180 mg  twice a day and  Ranitidine (Zantac) 150 mg twice a day  May use Benadryl as needed for breakthrough symptoms       If no symptoms for 7 days, then step down dosage

## 2016-12-19 NOTE — Progress Notes (Signed)
Follow-up Note  RE: April Blanchard MRN: [id number] DOB: [birth year] Date of Office Visit: 12/19/2016  Primary care provider: Antonietta Jewel, MD Referring provider: Antonietta Jewel, MD  History of present illness: April Herandez is a 31 y.o. female with persistent asthma, allergic rhinoconjunctivitis, and history of pruritus/urticaria presenting today for follow-up.  She was previously seen in this clinic for her initial evaluation on February 29, 2016.  She reports that her asthma has been well controlled in the interval since her previous visit.  While taking Symbicort 160-4.5 g, 2 inhalations via spacer device twice daily, and montelukast 10 mg daily bedtime, she has required albuterol rescue 1-2 times per week on average.  She does not wake up at night with chest tightness, coughing, or wheezing, however she notes that her husband told her that she takes long pauses in her breathing followed by a resuscitative snort which awakens her.  She has never been evaluated for sleep apnea.  She reports that her nasal and ocular allergy symptoms are not well controlled.  She states that she has been experiencing sneezing, rhinorrhea, nasal pruritus, ocular pruritus, and generalized pruritus.  She has tried to control the symptoms with cetirizine which causes somnolence and only provides temporary relief.  She admits that she has not been using medicated nasal spray as previously recommended.  The patient is interested in the possibility of starting aeroallergen immunotherapy to reduce symptoms and decrease medication requirement.  While experiencing generalized pruritus, she denies urticaria.   Assessment and plan: Perennial and seasonal allergic rhinitis Currently with suboptimal control.  The risks and benefits of aeroallergen immunotherapy have been discussed. The patient is motivated to initiate immunotherapy to reduce symptoms and decrease medication requirement. Informed consent has been signed and allergen  vaccine orders have been submitted. Medications will be decreased or discontinued as symptom relief from immunotherapy becomes evident.  Continue appropriate allergen avoidance measures and montelukast 10 mg daily at bedtime.  For persistent pruritus, instructions have been discussed and provided for H1/H2 receptor blockade with titration to find lowest effective dose.  A prescription has been provided for fexofenadine (Allegra) 180 mg daily as needed.  I recommended starting fluticasone nasal spray, 2 sprays per nostril daily as needed.  Nasal saline spray (i.e., Simply Saline) or nasal saline lavage (i.e., NeilMed) is recommended as needed and prior to medicated nasal sprays.  Moderate persistent asthma  Continue Symbicort 160-4.5 g, 2 inhalations via spacer device twice daily, montelukast 10 mg daily bedtime, and albuterol every 4-6 hours as needed.  Pruritus/urticaria  Instructions and prescriptions for H1/H2 receptor blockade have been provided (as above).  Continue montelukast 10 mg daily at bedtime.  Apnea  The patient's history suggests sleep apnea.  She will be referred to a pulmonologist for polysomnography.   Meds ordered this encounter  Medications  . fexofenadine (ALLEGRA) 180 MG tablet    Sig: Take 1 tablet (180 mg total) by mouth daily.    Dispense:  30 tablet    Refill:  3  . ranitidine (ZANTAC) 150 MG tablet    Sig: 1 tablet twice daily    Dispense:  60 tablet    Refill:  5  . EPINEPHrine (EPIPEN 2-PAK) 0.3 mg/0.3 mL IJ SOAJ injection    Sig: Use as directed for severe allergic reaction.    Dispense:  2 Device    Refill:  1    Diagnostics: Spirometry:  Normal with an FEV1 of 83% predicted.  Please see scanned spirometry results for details.  Physical examination: Blood pressure 96/66, pulse 84, temperature 98 F (36.7 C), temperature source Oral, resp. rate 16, SpO2 98 %.  General: Alert, interactive, in no acute distress. HEENT: TMs pearly  gray, turbinates moderately edematous with thick discharge, post-pharynx mildly erythematous. Neck: Supple without lymphadenopathy. Lungs: Clear to auscultation without wheezing, rhonchi or rales. CV: Normal S1, S2 without murmurs. Skin: Warm and dry, without lesions or rashes.  The following portions of the patient's history were reviewed and updated as appropriate: allergies, current medications, past family history, past medical history, past social history, past surgical history and problem list.  Allergies as of 12/19/2016   No Known Allergies     Medication List        Accurate as of 12/19/16  6:04 PM. Always use your most recent med list.          ALPRAZolam 0.5 MG tablet Commonly known as:  XANAX Take by mouth as needed.   cetirizine 10 MG tablet Commonly known as:  ZYRTEC Take 10 mg by mouth daily.   EPINEPHrine 0.3 mg/0.3 mL Soaj injection Commonly known as:  EPIPEN 2-PAK Use as directed for severe allergic reaction.   fexofenadine 180 MG tablet Commonly known as:  ALLEGRA Take 1 tablet (180 mg total) by mouth daily.   fluticasone 50 MCG/ACT nasal spray Commonly known as:  FLONASE Place 2 sprays into both nostrils daily.   MIRENA (52 MG) 20 MCG/24HR IUD Generic drug:  levonorgestrel Mirena 20 mcg/24 hr (5 years) intrauterine device  Take by intrauterine route.   montelukast 10 MG tablet Commonly known as:  SINGULAIR Take 1 tablet (10 mg total) by mouth at bedtime.   olopatadine 0.1 % ophthalmic solution Commonly known as:  PATANOL Place 1 drop into both eyes daily as needed for allergies.   PROAIR HFA 108 (90 Base) MCG/ACT inhaler Generic drug:  albuterol Inhale 2 puffs into the lungs every 6 (six) hours as needed for wheezing or shortness of breath.   QVAR 80 MCG/ACT inhaler Generic drug:  beclomethasone Inhale 1 puff into the lungs as needed.   ranitidine 150 MG tablet Commonly known as:  ZANTAC 1 tablet twice daily       No Known  Allergies  Review of systems: Review of systems negative except as noted in HPI / PMHx or noted below: Constitutional: Negative.  HENT: Negative.   Eyes: Negative.  Respiratory: Negative.   Cardiovascular: Negative.  Gastrointestinal: Negative.  Genitourinary: Negative.  Musculoskeletal: Negative.  Neurological: Negative.  Endo/Heme/Allergies: Negative.  Cutaneous: Negative.  Past Medical History:  Diagnosis Date  . Anemia   . Asthma    albuterol i month ago  . Palpitations     Family History  Problem Relation Age of Onset  . Diabetes Mother   . Asthma Mother   . Asthma Brother   . Asthma Maternal Uncle   . Diabetes Maternal Grandmother   . Asthma Father   . Other Neg Hx     Social History   Socioeconomic History  . Marital status: Married    Spouse name: Not on file  . Number of children: Not on file  . Years of education: Not on file  . Highest education level: Not on file  Social Needs  . Financial resource strain: Not on file  . Food insecurity - worry: Not on file  . Food insecurity - inability: Not on file  . Transportation needs - medical: Not on file  . Transportation needs - non-medical: Not on  file  Occupational History  . Not on file  Tobacco Use  . Smoking status: Never Smoker  . Smokeless tobacco: Never Used  Substance and Sexual Activity  . Alcohol use: No  . Drug use: No  . Sexual activity: Yes    Birth control/protection: IUD  Other Topics Concern  . Not on file  Social History Narrative  . Not on file    I appreciate the opportunity to take part in Olla's care. Please do not hesitate to contact me with questions.  Sincerely,   R. Edgar Frisk, MD

## 2016-12-23 ENCOUNTER — Telehealth: Payer: Self-pay | Admitting: *Deleted

## 2016-12-23 DIAGNOSIS — G4730 Sleep apnea, unspecified: Secondary | ICD-10-CM

## 2016-12-23 NOTE — Telephone Encounter (Signed)
-----   Message from Adelina Mings, MD sent at 12/19/2016  5:53 PM EST ----- Please put in a referral for polysomnography (probably with pulmonologist that does sleep studies). Patient has sleep apnea. Thanks.

## 2016-12-23 NOTE — Telephone Encounter (Signed)
Placed order for sleep study. Will have Lelan Pons or Elmyra Ricks follow up about scheduling.

## 2016-12-24 NOTE — Telephone Encounter (Signed)
Faxed over notes and referral.  They will call the patient to schedule.

## 2017-01-13 DIAGNOSIS — R079 Chest pain, unspecified: Secondary | ICD-10-CM | POA: Diagnosis not present

## 2017-01-13 DIAGNOSIS — J45901 Unspecified asthma with (acute) exacerbation: Secondary | ICD-10-CM | POA: Diagnosis not present

## 2017-01-13 DIAGNOSIS — M542 Cervicalgia: Secondary | ICD-10-CM | POA: Diagnosis not present

## 2017-01-13 DIAGNOSIS — Z87891 Personal history of nicotine dependence: Secondary | ICD-10-CM | POA: Diagnosis not present

## 2017-01-29 DIAGNOSIS — R1310 Dysphagia, unspecified: Secondary | ICD-10-CM | POA: Diagnosis not present

## 2017-01-29 DIAGNOSIS — F458 Other somatoform disorders: Secondary | ICD-10-CM | POA: Diagnosis not present

## 2017-01-29 DIAGNOSIS — K219 Gastro-esophageal reflux disease without esophagitis: Secondary | ICD-10-CM | POA: Diagnosis not present

## 2017-02-06 ENCOUNTER — Institutional Professional Consult (permissible substitution): Payer: BLUE CROSS/BLUE SHIELD | Admitting: Pulmonary Disease

## 2018-06-27 ENCOUNTER — Other Ambulatory Visit: Payer: Self-pay

## 2018-06-27 ENCOUNTER — Encounter (HOSPITAL_COMMUNITY): Payer: Self-pay | Admitting: Emergency Medicine

## 2018-06-27 ENCOUNTER — Ambulatory Visit (HOSPITAL_COMMUNITY)
Admission: EM | Admit: 2018-06-27 | Discharge: 2018-06-27 | Disposition: A | Discharge status: Home / Self Care | Payer: Medicaid Other | Attending: Family Medicine | Admitting: Family Medicine

## 2018-06-27 DIAGNOSIS — B9689 Other specified bacterial agents as the cause of diseases classified elsewhere: Secondary | ICD-10-CM | POA: Insufficient documentation

## 2018-06-27 DIAGNOSIS — N760 Acute vaginitis: Secondary | ICD-10-CM | POA: Insufficient documentation

## 2018-06-27 MED ORDER — FLUCONAZOLE 200 MG PO TABS: 200.0000 mg | ORAL_TABLET | Freq: Once | ORAL | 0 refills | Status: AC

## 2018-06-27 MED ORDER — METRONIDAZOLE 500 MG PO TABS: 500.0000 mg | ORAL_TABLET | Freq: Two times a day (BID) | ORAL | 0 refills | Status: DC

## 2018-06-27 NOTE — ED Triage Notes (Signed)
Per pt she is having discharge and a smell to the discharge. Pt is saying discharge is a lot and very liquidly. Pt is not worried about STD.

## 2018-06-27 NOTE — Discharge Instructions (Addendum)
You may need to wear condoms during intercourse to help prevent recurrent episodes of BV. Do NOT douche as this may make your symptoms worse.

## 2018-06-27 NOTE — ED Provider Notes (Signed)
[place]    CSN: [id number] Arrival date & time: 06/27/18  1317     History   Chief Complaint Chief Complaint  Patient presents with  . Vaginal Discharge    HPI April Blanchard is a 33 y.o. female presenting for acute concern of vaginal discharge with malodor.  Patient is is been ongoing for the last 4 days, states "it smells fishy ".  Patient had episode similar after giving birth to her youngest.  States that she was positive for BV at that time.  Patient currently sexually active with one female partner, currently has IUD in place, placed years ago.  Patient does note that she tends to get milder symptoms after having unprotected intercourse.  Has tried douching without relief of symptoms.  Patient denies concern for pregnancy, vaginal bleeding, vaginal or pelvic pain.  Patient agreeable to STD screening today.  Patient states that she does get yeast infections status post antibiotic use.    Past Medical History:  Diagnosis Date  . Anemia   . Asthma    albuterol i month ago  . Palpitations     Patient Active Problem List   Diagnosis Date Noted  . Apnea 12/19/2016  . Moderate persistent asthma 02/29/2016  . Perennial and seasonal allergic rhinitis 02/29/2016  . Allergic conjunctivitis 02/29/2016  . Pruritus/urticaria 02/29/2016    Past Surgical History:  Procedure Laterality Date  . TONSILLECTOMY      OB History    Gravida  4   Para  4   Term  4   Preterm  0   AB  0   Living  4     SAB  0   TAB  0   Ectopic  0   Multiple  0   Live Births  4            Home Medications    Prior to Admission medications   Medication Sig Start Date End Date Taking? Authorizing Provider  albuterol (PROAIR HFA) 108 (90 Base) MCG/ACT inhaler Inhale 2 puffs into the lungs every 6 (six) hours as needed for wheezing or shortness of breath.    [provider]  ALPRAZolam Duanne Moron) 0.5 MG tablet Take by mouth as needed.     [provider]   beclomethasone (QVAR) 80 MCG/ACT inhaler Inhale 1 puff into the lungs as needed.    [provider]  cetirizine (ZYRTEC) 10 MG tablet Take 10 mg by mouth daily.    [provider]  EPINEPHrine (EPIPEN 2-PAK) 0.3 mg/0.3 mL IJ SOAJ injection Use as directed for severe allergic reaction. 12/19/16   Bobbitt, Sedalia Muta, MD  fexofenadine (ALLEGRA) 180 MG tablet Take 1 tablet (180 mg total) by mouth daily. 12/19/16   Bobbitt, Sedalia Muta, MD  fluconazole (DIFLUCAN) 200 MG tablet Take 1 tablet (200 mg total) by mouth once for 1 dose. May repeat in 72 hours if needed 06/27/18 06/27/18  Hall-Potvin, [place], PA-C  fluticasone (FLONASE) 50 MCG/ACT nasal spray Place 2 sprays into both nostrils daily. Patient taking differently: Place 2 sprays into both nostrils as needed.  02/29/16   Bobbitt, Sedalia Muta, MD  levonorgestrel (MIRENA, 52 MG,) 20 MCG/24HR IUD Mirena 20 mcg/24 hr (5 years) intrauterine device  Take by intrauterine route. 06/21/16   [provider]  metroNIDAZOLE (FLAGYL) 500 MG tablet Take 1 tablet (500 mg total) by mouth 2 (two) times daily. 06/27/18   Hall-Potvin, [place], PA-C  montelukast (SINGULAIR) 10 MG tablet Take 1 tablet (  10 mg total) by mouth at bedtime. Patient not taking: Reported on 07/01/2016 02/29/16   Bobbitt, Sedalia Muta, MD  olopatadine (PATANOL) 0.1 % ophthalmic solution Place 1 drop into both eyes daily as needed for allergies. Patient not taking: Reported on 07/01/2016 02/29/16   Adelina Mings, MD  ranitidine (ZANTAC) 150 MG tablet 1 tablet twice daily 12/19/16   Bobbitt, Sedalia Muta, MD    Family History Family History  Problem Relation Age of Onset  . Diabetes Mother   . Asthma Mother   . Asthma Brother   . Asthma Maternal Uncle   . Diabetes Maternal Grandmother   . Asthma Father   . Other Neg Hx     Social History Social History   Tobacco Use  . Smoking status: Never Smoker  . Smokeless tobacco: Never Used  Substance Use Topics   . Alcohol use: No  . Drug use: No     Allergies   Patient has no known allergies.   Review of Systems As per HPI   Physical Exam Triage Vital Signs ED Triage Vitals [06/27/18 1410]  Enc Vitals Group     BP 114/75     Pulse Rate 90     Resp 16     Temp 98.2 F (36.8 C)     Temp Source Oral     SpO2 98 %     Weight      Height      Head Circumference      Peak Flow      Pain Score 0     Pain Loc      Pain Edu?      Excl. in [place]?    No data found.  Updated Vital Signs BP 114/75 (BP Location: Right Arm)   Pulse 90   Temp 98.2 F (36.8 C) (Oral)   Resp 16   SpO2 98%   Visual Acuity Right Eye Distance:   Left Eye Distance:   Bilateral Distance:    Right Eye Near:   Left Eye Near:    Bilateral Near:     Physical Exam Constitutional:      General: She is not in acute distress. HENT:     Head: Normocephalic and atraumatic.  Eyes:     General: No scleral icterus.    Pupils: Pupils are equal, round, and reactive to light.  Cardiovascular:     Rate and Rhythm: Normal rate.  Pulmonary:     Effort: Pulmonary effort is normal.  Abdominal:     General: Bowel sounds are normal.     Palpations: Abdomen is soft.     Tenderness: There is no abdominal tenderness. There is no right CVA tenderness, left CVA tenderness or guarding.  Genitourinary:    Comments: Patient declined, self-swab performed Skin:    Coloration: Skin is not jaundiced or pale.  Neurological:     Mental Status: She is alert and oriented to person, place, and time.      UC Treatments / Results  Labs (all labs ordered are listed, but only abnormal results are displayed) Labs Reviewed  CERVICOVAGINAL ANCILLARY ONLY    EKG None  Radiology No results found.  Procedures Procedures (including critical care time)  Medications Ordered in UC Medications - No data to display  Initial Impression / Assessment and Plan / UC Course  I have reviewed the triage vital signs and the nursing  notes.  Pertinent labs & imaging results that were available during my care of  the patient were reviewed by me and considered in my medical decision making (see chart for details).     33 year old female with remote history of BV presenting for similar symptoms.  Patient agreeable to STD screening.  Flagyl written, Diflucan given as patient has history of yeast infections status post antibiotic use.  Vaginal hygiene precautions, including possible condom use during intercourse as this seems to be a known exacerbated.  Return precautions discussed, patient verbalized understanding. Final Clinical Impressions(s) / UC Diagnoses   Final diagnoses:  BV (bacterial vaginosis)     Discharge Instructions     You may need to wear condoms during intercourse to help prevent recurrent episodes of BV. Do NOT douche as this may make your symptoms worse.    ED Prescriptions    Medication Sig Dispense Auth. Provider   metroNIDAZOLE (FLAGYL) 500 MG tablet Take 1 tablet (500 mg total) by mouth 2 (two) times daily. 14 tablet Hall-Potvin, [place], PA-C   fluconazole (DIFLUCAN) 200 MG tablet Take 1 tablet (200 mg total) by mouth once for 1 dose. May repeat in 72 hours if needed 2 tablet Hall-Potvin, [place], PA-C     Controlled Substance Prescriptions [place] Controlled Substance Registry consulted? Not Applicable   Quincy Sheehan, [place] 06/27/18 1455

## 2018-06-29 LAB — CERVICOVAGINAL ANCILLARY ONLY
Chlamydia: NEGATIVE
Neisseria Gonorrhea: NEGATIVE
Trichomonas: NEGATIVE

## 2018-08-12 ENCOUNTER — Other Ambulatory Visit: Payer: Self-pay

## 2018-08-12 ENCOUNTER — Encounter (HOSPITAL_COMMUNITY): Payer: Self-pay

## 2018-08-12 ENCOUNTER — Ambulatory Visit (HOSPITAL_COMMUNITY)
Admission: EM | Admit: 2018-08-12 | Discharge: 2018-08-12 | Disposition: A | Discharge status: Home / Self Care | Payer: Medicaid Other | Attending: Emergency Medicine | Admitting: Emergency Medicine

## 2018-08-12 DIAGNOSIS — Z3202 Encounter for pregnancy test, result negative: Secondary | ICD-10-CM

## 2018-08-12 DIAGNOSIS — N898 Other specified noninflammatory disorders of vagina: Secondary | ICD-10-CM | POA: Insufficient documentation

## 2018-08-12 LAB — POCT URINALYSIS DIP (DEVICE)
Bilirubin Urine: NEGATIVE
Glucose, UA: NEGATIVE mg/dL
Hgb urine dipstick: NEGATIVE
Ketones, ur: NEGATIVE mg/dL
Nitrite: NEGATIVE
Protein, ur: NEGATIVE mg/dL
Specific Gravity, Urine: >=1.03 (ref 1.005–1.030)
Urobilinogen, UA: 0.2 mg/dL (ref 0.0–1.0)
pH: 6.5 (ref 5.0–8.0)

## 2018-08-12 LAB — POCT PREGNANCY, URINE: Preg Test, Ur: NEGATIVE

## 2018-08-12 MED ORDER — FLUCONAZOLE 200 MG PO TABS: 200.0000 mg | ORAL_TABLET | Freq: Once | ORAL | 0 refills | Status: AC

## 2018-08-12 MED ORDER — METRONIDAZOLE 500 MG PO TABS: 500.0000 mg | ORAL_TABLET | Freq: Two times a day (BID) | ORAL | 0 refills | Status: DC

## 2018-08-12 NOTE — ED Triage Notes (Signed)
Pt states she has the same issue as the last visit. Pt states the medicine  she guess they did not work. Pt states she has a discharge that is very watery and it smells.

## 2018-08-12 NOTE — Discharge Instructions (Addendum)
Take antibiotic as prescribed. Return if you have abdominal pain, pelvic pain, blood in urine, worsening discharge.

## 2018-08-12 NOTE — ED Provider Notes (Signed)
[facility]    CSN: [id number] Arrival date & time: 08/12/18  1436     History   Chief Complaint Chief Complaint  Patient presents with  . Hip Pain    HPI April Blanchard is a 33 y.o. female presenting for lower abdominal pain and vaginal discharge since last appointment on 06/27/2018, during which she was seen by me.  Treated for BV and yeast infection at that time, took all her medications and noticed "50% improvement "in her vaginal discharge.  Patient still endorsing fishy malodor and vaginal irritation.  Patient practicing appropriate vaginal hygiene, though still having unprotected intercourse.    Past Medical History:  Diagnosis Date  . Anemia   . Asthma    albuterol i month ago  . Palpitations     Patient Active Problem List   Diagnosis Date Noted  . Apnea 12/19/2016  . Moderate persistent asthma 02/29/2016  . Perennial and seasonal allergic rhinitis 02/29/2016  . Allergic conjunctivitis 02/29/2016  . Pruritus/urticaria 02/29/2016    Past Surgical History:  Procedure Laterality Date  . TONSILLECTOMY      OB History    Gravida  4   Para  4   Term  4   Preterm  0   AB  0   Living  4     SAB  0   TAB  0   Ectopic  0   Multiple  0   Live Births  4            Home Medications    Prior to Admission medications   Medication Sig Start Date End Date Taking? Authorizing Provider  albuterol (PROAIR HFA) 108 (90 Base) MCG/ACT inhaler Inhale 2 puffs into the lungs every 6 (six) hours as needed for wheezing or shortness of breath.    [provider]  ALPRAZolam Duanne Moron) 0.5 MG tablet Take by mouth as needed.     [provider]  beclomethasone (QVAR) 80 MCG/ACT inhaler Inhale 1 puff into the lungs as needed.    [provider]  cetirizine (ZYRTEC) 10 MG tablet Take 10 mg by mouth daily.    [provider]  EPINEPHrine (EPIPEN 2-PAK) 0.3 mg/0.3 mL IJ SOAJ injection Use as directed for severe allergic  reaction. 12/19/16   Bobbitt, Sedalia Muta, MD  fexofenadine (ALLEGRA) 180 MG tablet Take 1 tablet (180 mg total) by mouth daily. 12/19/16   Bobbitt, Sedalia Muta, MD  fluconazole (DIFLUCAN) 200 MG tablet Take 1 tablet (200 mg total) by mouth once for 1 dose. May repeat in 72 hours if needed 08/12/18 08/12/18  Hall-Potvin, [place], PA-C  fluticasone (FLONASE) 50 MCG/ACT nasal spray Place 2 sprays into both nostrils daily. Patient taking differently: Place 2 sprays into both nostrils as needed.  02/29/16   Bobbitt, Sedalia Muta, MD  levonorgestrel (MIRENA, 52 MG,) 20 MCG/24HR IUD Mirena 20 mcg/24 hr (5 years) intrauterine device  Take by intrauterine route. 06/21/16   [provider]  metroNIDAZOLE (FLAGYL) 500 MG tablet Take 1 tablet (500 mg total) by mouth 2 (two) times daily. 08/12/18   Hall-Potvin, [place], PA-C  montelukast (SINGULAIR) 10 MG tablet Take 1 tablet (10 mg total) by mouth at bedtime. Patient not taking: Reported on 07/01/2016 02/29/16   Bobbitt, Sedalia Muta, MD  olopatadine (PATANOL) 0.1 % ophthalmic solution Place 1 drop into both eyes daily as needed for allergies. Patient not taking: Reported on 07/01/2016 02/29/16   Bobbitt, Sedalia Muta, MD  ranitidine (ZANTAC) 150 MG tablet  1 tablet twice daily 12/19/16   Bobbitt, Sedalia Muta, MD    Family History Family History  Problem Relation Age of Onset  . Diabetes Mother   . Asthma Mother   . Asthma Brother   . Asthma Maternal Uncle   . Diabetes Maternal Grandmother   . Asthma Father   . Other Neg Hx     Social History Social History   Tobacco Use  . Smoking status: Never Smoker  . Smokeless tobacco: Never Used  Substance Use Topics  . Alcohol use: No  . Drug use: No     Allergies   Patient has no known allergies.   Review of Systems Review of Systems  Constitutional: Negative for fatigue and fever.  HENT: Negative for ear pain, sinus pain, sore throat and voice change.   Eyes: Negative for pain, redness  and visual disturbance.  Respiratory: Negative for cough and shortness of breath.   Cardiovascular: Negative for chest pain and palpitations.  Gastrointestinal: Positive for abdominal pain. Negative for diarrhea and vomiting.  Genitourinary: Positive for vaginal discharge. Negative for dyspareunia, dysuria, frequency, pelvic pain, urgency, vaginal bleeding and vaginal pain.  Musculoskeletal: Negative for arthralgias and myalgias.  Skin: Negative for rash and wound.  Neurological: Negative for syncope and headaches.     Physical Exam Triage Vital Signs ED Triage Vitals  Enc Vitals Group     BP 08/12/18 1517 109/64     Pulse Rate 08/12/18 1517 64     Resp --      Temp 08/12/18 1517 97.9 F (36.6 C)     Temp Source 08/12/18 1517 Temporal     SpO2 08/12/18 1517 100 %     Weight 08/12/18 1529 240 lb (108.9 kg)     Height --      Head Circumference --      Peak Flow --      Pain Score 08/12/18 1529 6     Pain Loc --      Pain Edu? --      Excl. in [place]? --    No data found.  Updated Vital Signs BP 109/64 (BP Location: Left Arm)   Pulse 64   Temp 97.9 F (36.6 C) (Temporal)   Wt 240 lb (108.9 kg)   SpO2 100%   BMI 41.20 kg/m    Physical Exam Constitutional:      General: She is not in acute distress. HENT:     Head: Normocephalic and atraumatic.  Eyes:     General: No scleral icterus.    Pupils: Pupils are equal, round, and reactive to light.  Cardiovascular:     Rate and Rhythm: Normal rate.  Pulmonary:     Effort: Pulmonary effort is normal.  Genitourinary:    Exam position: Supine.     Pubic Area: No rash.      Tanner stage (genital): 5.     Labia:        Right: No rash or lesion.        Left: No rash or lesion.      Urethra: No prolapse, urethral pain or urethral swelling.     Vagina: No foreign body. Vaginal discharge present.     Cervix: Normal.     Uterus: Normal.      Adnexa:        Right: No tenderness.         Left: No tenderness.       Comments:  IUD string visualized Skin:  Coloration: Skin is not jaundiced or pale.  Neurological:     Mental Status: She is alert and oriented to person, place, and time.      UC Treatments / Results  Labs (all labs ordered are listed, but only abnormal results are displayed) Labs Reviewed  POCT URINALYSIS DIP (DEVICE) - Abnormal; Notable for the following components:      Result Value   Leukocytes,Ua SMALL (*)    All other components within normal limits  URINE CULTURE  POC URINE PREG, ED  POCT PREGNANCY, URINE  CERVICOVAGINAL ANCILLARY ONLY    EKG   Radiology No results found.  Procedures Procedures (including critical care time)  Medications Ordered in UC Medications - No data to display  Initial Impression / Assessment and Plan / UC Course  I have reviewed the triage vital signs and the nursing notes.  Pertinent labs & imaging results that were available during my care of the patient were reviewed by me and considered in my medical decision making (see chart for details).      1.  Vaginal discharge History and exam concerning for BV.  Will treat with oral Flagyl per patient's request with Diflucan to take at end of course due to antibiotic induced yeast infections, cervical vaginal cytology pending.  Encourage patient to wear condoms during intercourse for additional relief.  Return precautions discussed, patient verbalized understanding and is agreeable to plan. Final Clinical Impressions(s) / UC Diagnoses   Final diagnoses:  Vaginal discharge     Discharge Instructions     Take antibiotic as prescribed. Return if you have abdominal pain, pelvic pain, blood in urine, worsening discharge.    ED Prescriptions    Medication Sig Dispense Auth. Provider   metroNIDAZOLE (FLAGYL) 500 MG tablet Take 1 tablet (500 mg total) by mouth 2 (two) times daily. 14 tablet Hall-Potvin, [place], PA-C   fluconazole (DIFLUCAN) 200 MG tablet Take 1 tablet (200 mg total) by mouth  once for 1 dose. May repeat in 72 hours if needed 2 tablet Hall-Potvin, [place], PA-C     Controlled Substance Prescriptions [place] Controlled Substance Registry consulted? Not Applicable   Quincy Sheehan, Hershal Coria 08/12/18 2033

## 2018-08-13 LAB — URINE CULTURE

## 2018-08-15 LAB — CERVICOVAGINAL ANCILLARY ONLY
Bacterial vaginitis: POSITIVE — AB
Candida vaginitis: NEGATIVE
Chlamydia: NEGATIVE
Neisseria Gonorrhea: NEGATIVE
Trichomonas: NEGATIVE

## 2018-08-24 ENCOUNTER — Ambulatory Visit: Payer: Self-pay | Admitting: Internal Medicine

## 2018-08-24 ENCOUNTER — Encounter: Payer: Self-pay | Admitting: Internal Medicine

## 2018-08-24 ENCOUNTER — Other Ambulatory Visit: Payer: Self-pay

## 2018-08-24 DIAGNOSIS — Z975 Presence of (intrauterine) contraceptive device: Secondary | ICD-10-CM

## 2018-08-24 DIAGNOSIS — Z8742 Personal history of other diseases of the female genital tract: Secondary | ICD-10-CM

## 2018-08-24 DIAGNOSIS — N760 Acute vaginitis: Secondary | ICD-10-CM

## 2018-08-24 DIAGNOSIS — D219 Benign neoplasm of connective and other soft tissue, unspecified: Secondary | ICD-10-CM

## 2018-08-24 DIAGNOSIS — N898 Other specified noninflammatory disorders of vagina: Secondary | ICD-10-CM | POA: Insufficient documentation

## 2018-08-24 NOTE — Patient Instructions (Signed)
Today's visit summary:  It was a pleasure to meet you today. I have submitted your referral to Gynecology. You will be scheduled for an appointment once you get finacial assistance. Please call pur office if you have any questions or concerns.     Best regards,  Marianna Payment, DO

## 2018-08-24 NOTE — Progress Notes (Signed)
   CC: Vaginal Discharge  HPI:  Ms.April Blanchard is a 33 y.o. female who presented with a past medical history stated below and presented with vaginal discharge since 07/2018. See Problem Base Charting for more information.   Past Medical History:  Diagnosis Date  . Anemia   . Asthma    albuterol i month ago  . Palpitations    Review of Systems:  Review of Systems  Constitutional: Negative for chills, fever and malaise/fatigue.  Gastrointestinal: Positive for abdominal pain.  Genitourinary: Negative for dysuria, flank pain, frequency and urgency.       Clear vaginal discharge, groin and suprapubic pain, dyspareunia.      Physical Exam:  Vitals:   08/24/18 1041  BP: 116/77  Pulse: 98  Temp: 98.3 F (36.8 C)  TempSrc: Oral  SpO2: 99%  Weight: 180 lb 12.8 oz (82 kg)  Height: 5\' 5"  (1.651 m)   Lifestyle  Physical activity  . Days per week: Not on file  . Minutes per session: Not on file    Physical Exam  Constitutional: She is oriented to person, place, and time. She appears well-developed and well-nourished. She appears distressed (mild).  Cardiovascular: Normal rate, regular rhythm and normal heart sounds.  Respiratory: Effort normal and breath sounds normal. No respiratory distress.  GI: Soft. Bowel sounds are normal. She exhibits no distension and no mass. There is abdominal tenderness (suprapubic pain). There is no rebound and no CVA tenderness.  Genitourinary:    Genitourinary Comments: Patient recently received a pelvic exam in July 2020.   Neurological: She is alert and oriented to person, place, and time.  Psychiatric: She has a normal mood and affect. Her behavior is normal.     Assessment & Plan:   See Encounters Tab for problem based charting.  Patient seen with Dr. Dareen Piano

## 2018-08-24 NOTE — Progress Notes (Signed)
Patient expressed wish to wait for [place] prior to making appointment for GYN.  Will collect information to return to speak with Financial Counselor.  Sander Nephew, RN 08/24/2018 11:15 AM.

## 2018-08-25 NOTE — Progress Notes (Signed)
Internal Medicine Clinic Attending  I saw and evaluated the patient.  I personally confirmed the key portions of the history and exam documented by Dr. Coe and I reviewed pertinent patient test results.  The assessment, diagnosis, and plan were formulated together and I agree with the documentation in the resident's note.    

## 2018-08-25 NOTE — Assessment & Plan Note (Signed)
Vaginal discharge has been present since 06/2018. She was seen in the ED at [facility] on 2 separate occasions complaining of clear vaginal discharge, "fishy odor", groin/suprapubic pain, and dyspareunia. She was positive for Bacterial Vaginosis and treated with two courses for Metronidazole 500 mg po for 7 days. Patient expressed history of yeast infections and was prescribed fluconazole to be taken after each course of antibiotics. After the first course, the patient's symptoms did not fully resolve. Patient is in a monogamous relationship with her husband and uses an IUD for birth control. She was told to use barrier contraceptive while being treated for BV, but states that she has not had sexual intercourse in the last 3 months due to her ongoing symptoms. She was previously worked up for dyspareunia by GYN on 07/02/2018. A transvaginal US showed an leiomyoma and the patient was advice to have it removed, which she decline. The patient does not have health insurance and was set up for financial assistance today.  Plan: - Patient was set up with finacial assistance - Referred to GYN for management of recurrent Bacterial Vaginosis. We will hold off on the referral until the patient has received financial assistance.  - If that patient can get into see a gynecologist soon, we should consider starting the patient on suppression therapy with oral antibiotics.

## 2018-09-03 ENCOUNTER — Ambulatory Visit: Payer: Medicaid Other

## 2018-09-04 ENCOUNTER — Ambulatory Visit: Payer: Medicaid Other

## 2018-09-14 ENCOUNTER — Ambulatory Visit: Payer: Medicaid Other

## 2018-09-16 ENCOUNTER — Encounter: Payer: Self-pay | Admitting: Internal Medicine

## 2018-10-22 ENCOUNTER — Encounter: Payer: Medicaid Other | Admitting: Family Medicine

## 2018-10-23 ENCOUNTER — Ambulatory Visit (INDEPENDENT_AMBULATORY_CARE_PROVIDER_SITE_OTHER): Payer: Medicaid Other | Admitting: Obstetrics & Gynecology

## 2018-10-23 ENCOUNTER — Other Ambulatory Visit (HOSPITAL_COMMUNITY)
Admission: RE | Admit: 2018-10-23 | Discharge: 2018-10-23 | Disposition: A | Discharge status: Home / Self Care | Payer: Medicaid Other | Source: Ambulatory Visit | Attending: Family Medicine | Admitting: Family Medicine

## 2018-10-23 ENCOUNTER — Encounter: Payer: Self-pay | Admitting: Obstetrics & Gynecology

## 2018-10-23 ENCOUNTER — Other Ambulatory Visit: Payer: Self-pay

## 2018-10-23 VITALS — Ht 66.0 in | Wt 179.0 lb

## 2018-10-23 DIAGNOSIS — B9689 Other specified bacterial agents as the cause of diseases classified elsewhere: Secondary | ICD-10-CM

## 2018-10-23 DIAGNOSIS — N760 Acute vaginitis: Secondary | ICD-10-CM

## 2018-10-23 DIAGNOSIS — A499 Bacterial infection, unspecified: Secondary | ICD-10-CM

## 2018-10-23 DIAGNOSIS — N898 Other specified noninflammatory disorders of vagina: Secondary | ICD-10-CM | POA: Diagnosis not present

## 2018-10-23 MED ORDER — BORIC ACID CRYS: 600.0000 mg | CRYSTALS | Freq: Every day | 5 refills | Status: DC

## 2018-10-23 MED ORDER — METRONIDAZOLE 500 MG PO TABS: 500.0000 mg | ORAL_TABLET | Freq: Two times a day (BID) | ORAL | 0 refills | Status: DC

## 2018-10-23 MED ORDER — BORIC ACID CRYS: 600.0000 mg | CRYSTALS | Freq: Every day | 5 refills | Status: AC

## 2018-10-23 NOTE — Patient Instructions (Signed)
Bacterial Vaginosis  Bacterial vaginosis is a vaginal infection that occurs when the normal balance of bacteria in the vagina is disrupted. It results from an overgrowth of certain bacteria. This is the most common vaginal infection among women ages 13-44. Because bacterial vaginosis increases your risk for STIs (sexually transmitted infections), getting treated can help reduce your risk for chlamydia, gonorrhea, herpes, and HIV (human immunodeficiency virus). Treatment is also important for preventing complications in pregnant women, because this condition can cause an early (premature) delivery. What are the causes? This condition is caused by an increase in harmful bacteria that are normally present in small amounts in the vagina. However, the reason that the condition develops is not fully understood. What increases the risk? The following factors may make you more likely to develop this condition:  Having a new sexual partner or multiple sexual partners.  Having unprotected sex.  Douching.  Having an intrauterine device (IUD).  Smoking.  Drug and alcohol abuse.  Taking certain antibiotic medicines.  Being pregnant. You cannot get bacterial vaginosis from toilet seats, bedding, swimming pools, or contact with objects around you. What are the signs or symptoms? Symptoms of this condition include:  Grey or white vaginal discharge. The discharge can also be watery or foamy.  A fish-like odor with discharge, especially after sexual intercourse or during menstruation.  Itching in and around the vagina.  Burning or pain with urination. Some women with bacterial vaginosis have no signs or symptoms. How is this diagnosed? This condition is diagnosed based on:  Your medical history.  A physical exam of the vagina.  Testing a sample of vaginal fluid under a microscope to look for a large amount of bad bacteria or abnormal cells. Your health care provider may use a cotton  swab or a small wooden spatula to collect the sample. How is this treated? This condition is treated with antibiotics. These may be given as a pill, a vaginal cream, or a medicine that is put into the vagina (suppository). If the condition comes back after treatment, a second round of antibiotics may be needed. Follow these instructions at home: Medicines  Take over-the-counter and prescription medicines only as told by your health care provider.  Take or use your antibiotic as told by your health care provider. Do not stop taking or using the antibiotic even if you start to feel better. General instructions  If you have a female sexual partner, tell her that you have a vaginal infection. She should see her health care provider and be treated if she has symptoms. If you have a female sexual partner, he does not need treatment.  During treatment: ? Avoid sexual activity until you finish treatment. ? Do not douche. ? Avoid alcohol as directed by your health care provider. ? Avoid breastfeeding as directed by your health care provider.  Drink enough water and fluids to keep your urine clear or pale yellow.  Keep the area around your vagina and rectum clean. ? Wash the area daily with warm water. ? Wipe yourself from front to back after using the toilet.  Keep all follow-up visits as told by your health care provider. This is important. How is this prevented?  Do not douche.  Wash the outside of your vagina with warm water only.  Use protection when having sex. This includes latex condoms and dental dams.  Limit how many sexual partners you have. To help prevent bacterial vaginosis, it is best to have sex with just  one partner (monogamous).  Make sure you and your sexual partner are tested for STIs.  Wear cotton or cotton-lined underwear.  Avoid wearing tight pants and pantyhose, especially during summer.  Limit the amount of alcohol that you drink.  Do not use any products that  contain nicotine or tobacco, such as cigarettes and e-cigarettes. If you need help quitting, ask your health care provider.  Do not use illegal drugs. Where to find more information  Centers for Disease Control and Prevention: AppraiserFraud.fi  American Sexual Health Association (ASHA): www.ashastd.org  U.S. Department of Health and Financial controller, Office on Women's Health: DustingSprays.pl or SecuritiesCard.it Contact a health care provider if:  Your symptoms do not improve, even after treatment.  You have more discharge or pain when urinating.  You have a fever.  You have pain in your abdomen.  You have pain during sex.  You have vaginal bleeding between periods. Summary  Bacterial vaginosis is a vaginal infection that occurs when the normal balance of bacteria in the vagina is disrupted.  Because bacterial vaginosis increases your risk for STIs (sexually transmitted infections), getting treated can help reduce your risk for chlamydia, gonorrhea, herpes, and HIV (human immunodeficiency virus). Treatment is also important for preventing complications in pregnant women, because the condition can cause an early (premature) delivery.  This condition is treated with antibiotic medicines. These may be given as a pill, a vaginal cream, or a medicine that is put into the vagina (suppository). This information is not intended to replace advice given to you by your health care provider. Make sure you discuss any questions you have with your health care provider. Document Released: 01/07/2005 Document Revised: 12/20/2016 Document Reviewed: 09/23/2015 Elsevier Patient Education  2020 [place]: Soap: UNSCENTED Dove (white box light green writing) Laundry detergent (underwear)- Dreft or Arm n' Hammer unscented WHITE 100% cotton panties (NOT just cotton crouch) Sanitary napkin/panty liners: UNSCENTED.  If it doesn't SAY  unscented it can have a scent/perfume    NO PERFUMES OR LOTIONS OR POTIONS in the vulvar area (may use regular [place]) Condoms: hypoallergenic only. Non dyed (no color) Toilet papers: white only Wash clothes: use a separate wash cloth. WHITE.  Washed in Dreft.

## 2018-10-23 NOTE — Progress Notes (Signed)
Subjective:     April Blanchard is a 33 y.o. female here for eval for recurrent BV. Pt is extremely frustrated by these sx. She reports that it is affecting her work life and marriage. The pt thinks that the sx are related to the IUD which is working great for contraception but, this aspect of it is unacceptable. She denies STIs. She is married and monogamous. She reports heavy liquid discharge with an odor. She has been treated multip times but, the meds don't seem to work.        Gynecologic History No LMP recorded (exact date). (Menstrual status: IUD). Contraception: IUD Last Pap: UTD per pt Last mammogram: n/a  Obstetric History OB History  Gravida Para Term Preterm AB Living  4 4 4  0 0 4  SAB TAB Ectopic Multiple Live Births  0 0 0 0 4    # Outcome Date GA Lbr Len/2nd Weight Sex Delivery Anes PTL Lv  4 Term 06/19/11 [redacted]w[redacted]d 07:01 / 00:18 6 lb 4 oz (2.835 kg) F Vag-Spont EPI  LIV  3 Term 2012 [redacted]w[redacted]d  7 lb 9 oz (3.43 kg) F Vag-Spont   LIV  2 Term 2010 [redacted]w[redacted]d  7 lb 12 oz (3.515 kg) M Vag-Spont   LIV  1 Term 2010 [redacted]w[redacted]d  7 lb 8 oz (3.402 kg) F Vag-Spont   LIV     The following portions of the patient's history were reviewed and updated as appropriate: allergies, current medications, past family history, past medical history, past social history, past surgical history and problem list.  Review of Systems Pertinent items are noted in HPI.    Objective:   Ht 5\' 6"  (1.676 m)   Wt 179 lb (81.2 kg)   LMP  (Exact Date)   BMI 28.89 kg/m   CONSTITUTIONAL: Well-developed, well-nourished female in no acute distress.  HENT:  Normocephalic, atraumatic EYES: Conjunctivae and EOM are normal. No scleral icterus.  NECK: Normal range of motion SKIN: Skin is warm and dry. No rash noted. Not diaphoretic.No pallor. [place]: Alert and oriented to person, place, and time. Normal coordination.   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 -     Discontinue: Boric Acid CRYS; Place 600 mg vaginally at bedtime. Use vaginally every night for 21 days. DO NOT TAKE BY MOUTH -     Boric Acid CRYS; Place 600 mg vaginally at bedtime. Use vaginally every night for 21 days. DO NOT TAKE BY MOUTH  Recurrent bacterial infection -     metroNIDAZOLE (FLAGYL) 500 MG tablet; Take 1 tablet (500 mg total) by mouth 2 (two) times daily. -     Discontinue: Boric Acid CRYS; Place 600 mg vaginally at bedtime. Use vaginally every night for 21 days. DO NOT TAKE BY MOUTH -     Boric Acid CRYS; Place 600 mg vaginally at bedtime. Use vaginally every night for 21 days. DO NOT TAKE BY MOUTH  BV (bacterial vaginosis) -     metroNIDAZOLE (FLAGYL) 500 MG tablet; Take 1  tablet (500 mg total) by mouth 2 (two) times daily. -     Discontinue: Boric Acid CRYS; Place 600 mg vaginally at bedtime. Use vaginally every night for 21 days. DO NOT TAKE BY MOUTH -     Boric Acid CRYS; Place 600 mg vaginally at bedtime. Use vaginally every night for 21 days. DO NOT TAKE BY MOUTH   Reviewed GO WHITE instructions: GO WHITE: Soap: UNSCENTED Dove (white box light green writing) Laundry detergent (underwear)- Dreft or Arm n' Hammer unscented WHITE 100% cotton panties (NOT just cotton crouch) Sanitary napkin/panty liners: UNSCENTED.  If it doesn't SAY unscented it can have a scent/perfume    NO PERFUMES OR LOTIONS OR POTIONS in the vulvar area (may use regular [place]) Condoms: hypoallergenic only. Non dyed (no color) Toilet papers: white only Wash clothes: use a separate wash cloth. WHITE.  Washed in Dreft.   Total  face-to-face time with patient was 20 min.  Greater than 50% was spent in counseling and coordination of care with the patient.   Delaney Perona L. Harraway-Smith, M.D., Cherlynn June

## 2018-10-23 NOTE — Progress Notes (Signed)
Patient does not have period with IUD. Patient complaining of fishy odor with discharge. Patient states she has tried treatment with antibiotics for two times (July and August) and even tried [facility] balance pills over the counter. Patient doesn't know the last time she had a pap smear (records indicate 2013). [name] RN

## 2018-10-26 LAB — CERVICOVAGINAL ANCILLARY ONLY
Bacterial Vaginitis (gardnerella): POSITIVE — AB
Candida Glabrata: NEGATIVE
Candida Vaginitis: NEGATIVE
Chlamydia: NEGATIVE
Neisseria Gonorrhea: NEGATIVE

## 2018-11-02 ENCOUNTER — Telehealth: Payer: Self-pay

## 2018-11-02 NOTE — Telephone Encounter (Signed)
-----   Message from Lavonia Drafts, MD sent at 10/30/2018  2:50 PM EDT ----- Please call pt. Her lab did reveal BV> She should cont the strict instructions given to her. Please make sure she was able to get the Boric acid.   Thx,  Clh-S

## 2018-11-02 NOTE — Telephone Encounter (Signed)
Called pt regarding results. Pt made aware that she has BV and to continue the strict instructions given to her. Pt states she has started using the Boric acid.  April Blanchard l Roseline Ebarb, CMA

## 2018-12-04 ENCOUNTER — Ambulatory Visit: Payer: Medicaid Other | Admitting: Obstetrics & Gynecology

## 2019-02-05 ENCOUNTER — Ambulatory Visit: Payer: Medicaid Other | Admitting: Obstetrics & Gynecology

## 2019-02-05 IMAGING — US US TRANSVAGINAL NON-OB
1 series · 14 of 25 positions shown · non-contrast
Comparison: CT abdomen pelvis 01/24/2016

CLINICAL DATA: Patient with pelvic pain during intercourse.

EXAM:
TRANSABDOMINAL AND TRANSVAGINAL ULTRASOUND OF PELVIS
TECHNIQUE: Both transabdominal and transvaginal ultrasound examinations of the
pelvis were performed. Transabdominal technique was performed for
global imaging of the pelvis including uterus, ovaries, adnexal
regions, and pelvic cul-de-sac. It was necessary to proceed with
endovaginal exam following the transabdominal exam to visualize the
endometrium and adnexal structures.

[Series 1: us transvaginal non-ob · 0.24mm/px · 14 of 45 slices shown]
[im 1/45]
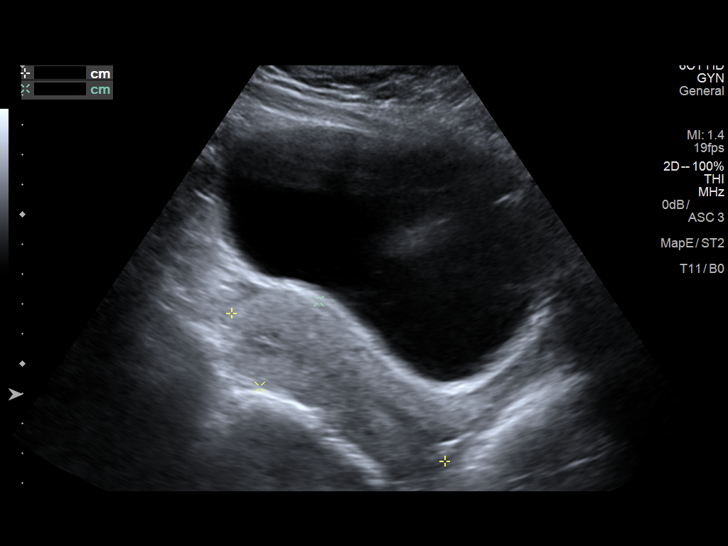
[im 4/45]
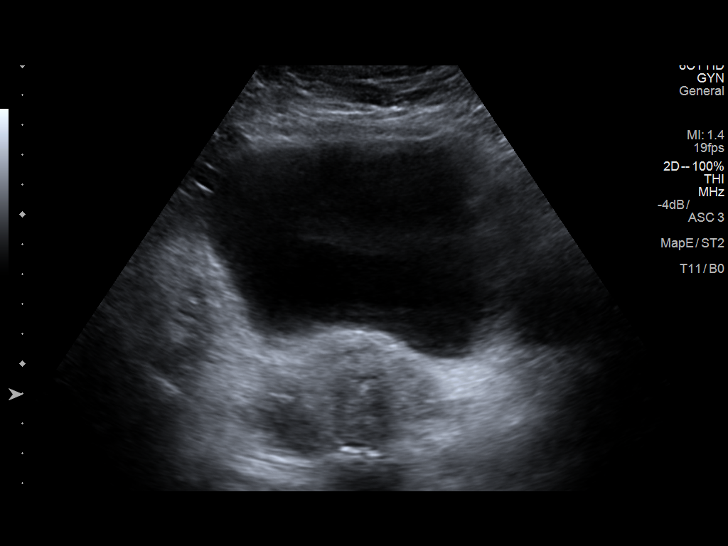
[im 8/45]
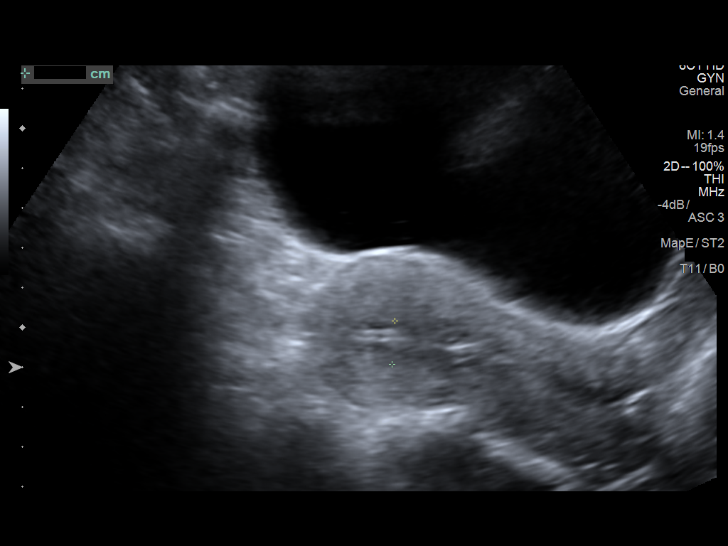
[im 12/45]
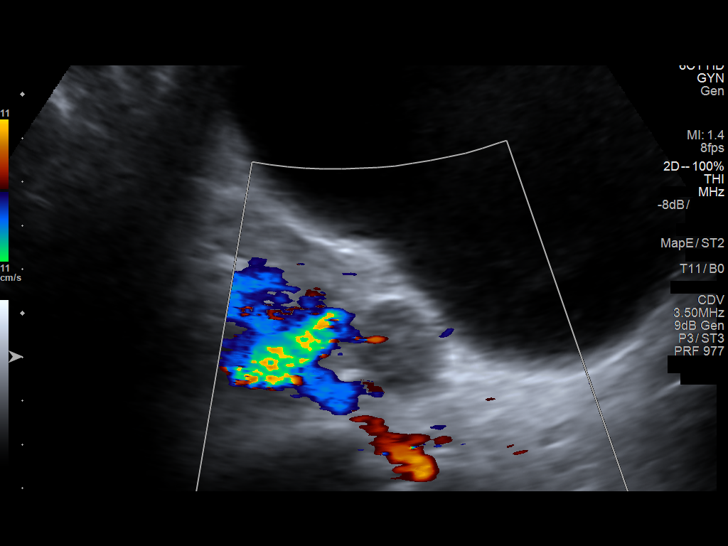
[im 15/45]
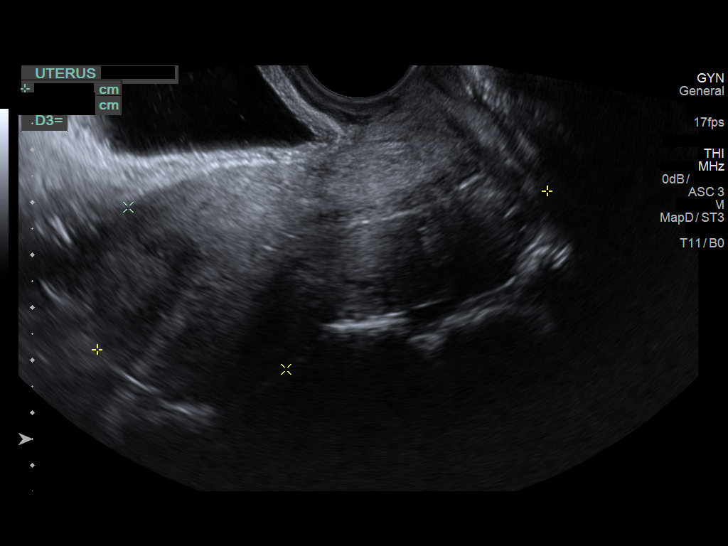
[im 17/45]
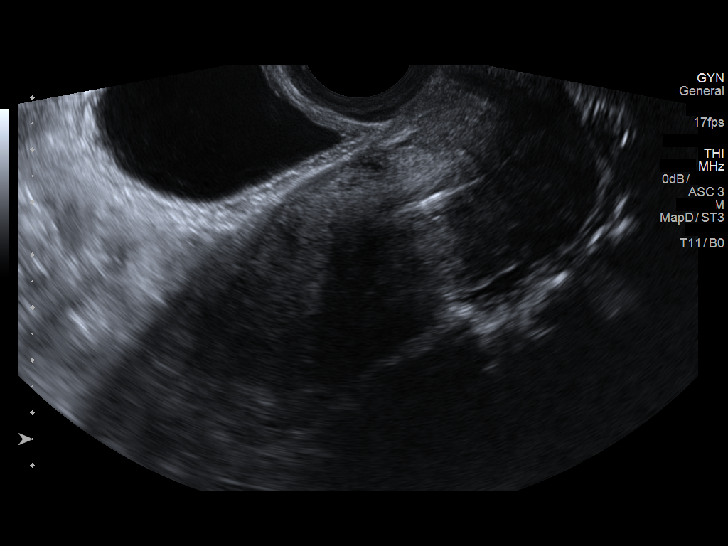
[im 21/45]
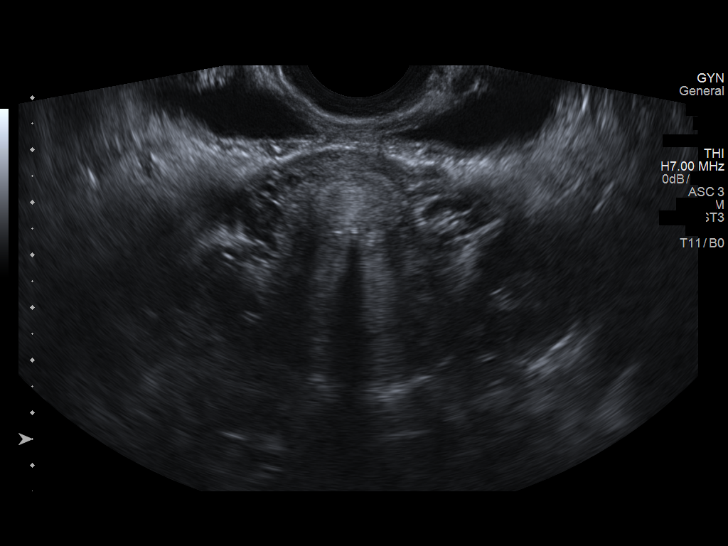
[im 24/45]
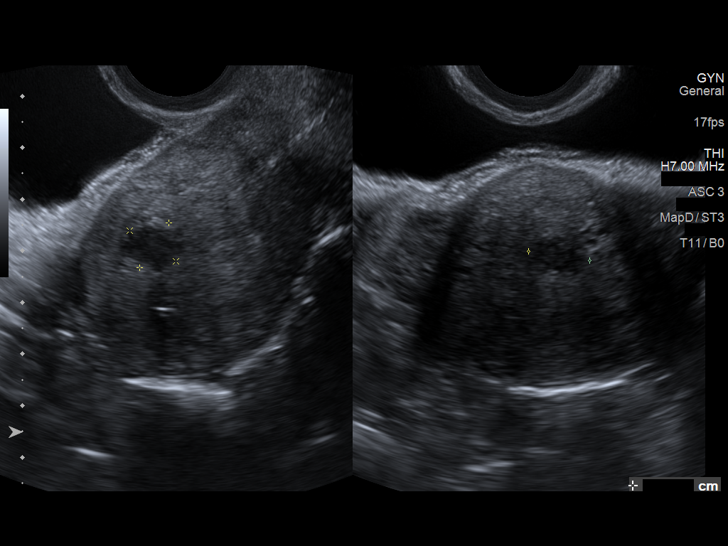
[im 28/45]
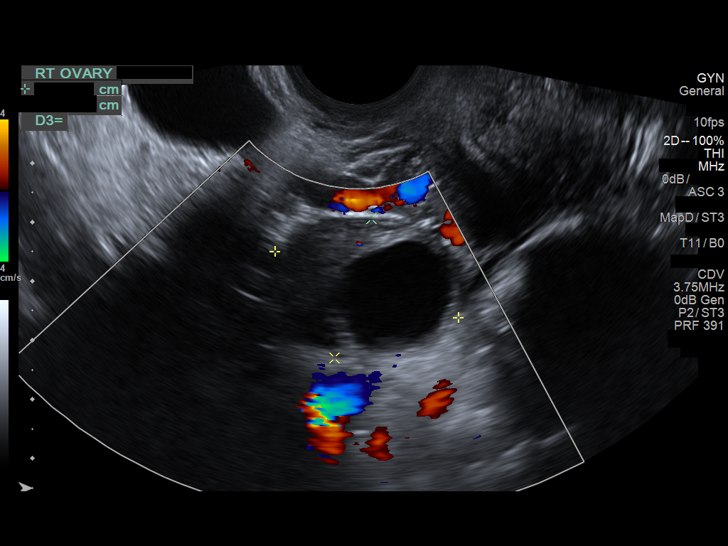
[im 30/45]
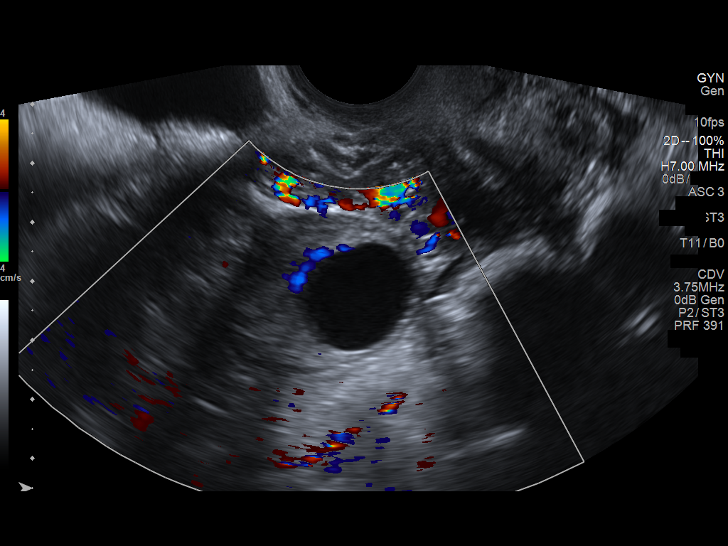
[im 34/45]
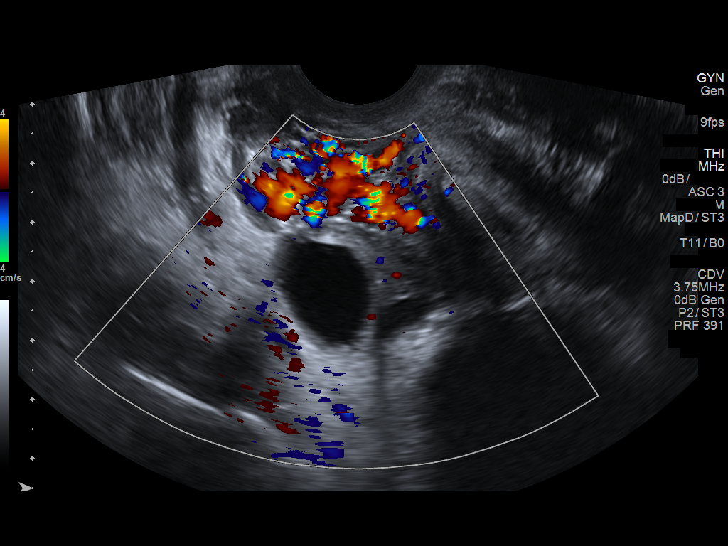
[im 37/45]
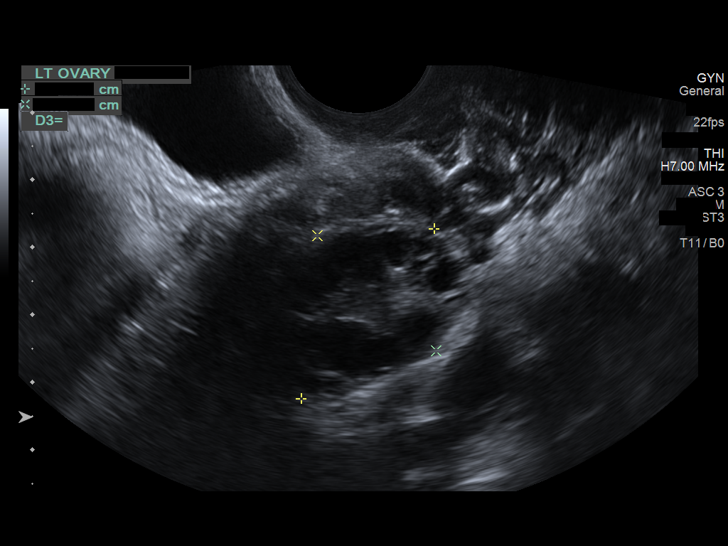
[im 41/45]
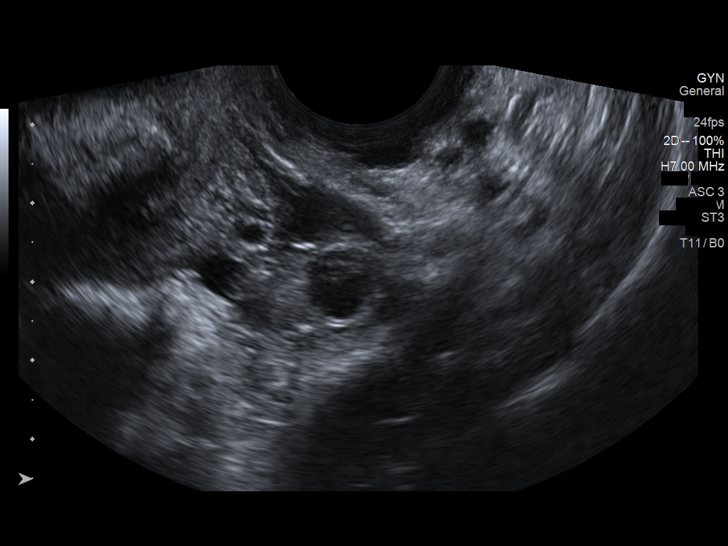
[im 45/45]
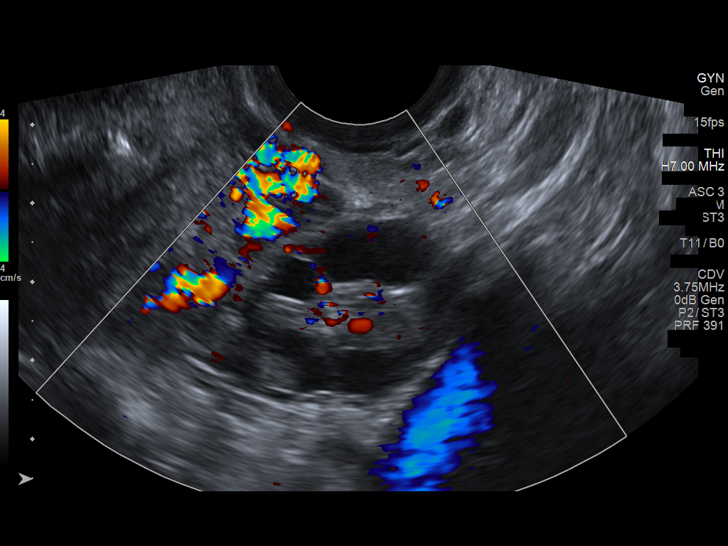

[14 of 25 positions shown; findings below may reference images not displayed]

FINDINGS: Uterus

Measurements: 9.1 x 4.3 x 4.2 cm. There is a 1.0 x 1.0 x 1.2 cm
intramural fibroid within the left aspect of the uterine fundus.

Endometrium

Thickness: 6 mm.  Intrauterine device in appropriate position.

Right ovary

Measurements: 3.3 x 2.4 x 3.0 cm. Normal appearance/no adnexal mass.

Left ovary

Measurements: 3.2 x 2.5 x 2.3 cm. Normal appearance/no adnexal mass.

Other findings

No abnormal free fluid.
IMPRESSION: No acute process.

Intrauterine device appears in appropriate position.

## 2019-06-02 DIAGNOSIS — M545 Low back pain: Secondary | ICD-10-CM | POA: Diagnosis not present

## 2019-06-02 DIAGNOSIS — I498 Other specified cardiac arrhythmias: Secondary | ICD-10-CM | POA: Diagnosis not present

## 2019-06-02 DIAGNOSIS — E785 Hyperlipidemia, unspecified: Secondary | ICD-10-CM | POA: Diagnosis not present

## 2019-06-02 DIAGNOSIS — M2550 Pain in unspecified joint: Secondary | ICD-10-CM | POA: Diagnosis not present

## 2019-06-02 DIAGNOSIS — R0602 Shortness of breath: Secondary | ICD-10-CM | POA: Diagnosis not present

## 2019-06-02 DIAGNOSIS — F419 Anxiety disorder, unspecified: Secondary | ICD-10-CM | POA: Diagnosis not present

## 2019-06-02 DIAGNOSIS — M79609 Pain in unspecified limb: Secondary | ICD-10-CM | POA: Diagnosis not present

## 2019-06-02 DIAGNOSIS — R609 Edema, unspecified: Secondary | ICD-10-CM | POA: Diagnosis not present

## 2019-06-02 DIAGNOSIS — R5383 Other fatigue: Secondary | ICD-10-CM | POA: Diagnosis not present

## 2019-06-02 DIAGNOSIS — Z0001 Encounter for general adult medical examination with abnormal findings: Secondary | ICD-10-CM | POA: Diagnosis not present

## 2019-06-02 DIAGNOSIS — D509 Iron deficiency anemia, unspecified: Secondary | ICD-10-CM | POA: Diagnosis not present

## 2019-06-07 ENCOUNTER — Other Ambulatory Visit (HOSPITAL_COMMUNITY)
Admission: RE | Admit: 2019-06-07 | Discharge: 2019-06-07 | Disposition: A | Discharge status: Home / Self Care | Payer: Medicaid Other | Source: Ambulatory Visit | Attending: Obstetrics & Gynecology | Admitting: Obstetrics & Gynecology

## 2019-06-07 ENCOUNTER — Ambulatory Visit (INDEPENDENT_AMBULATORY_CARE_PROVIDER_SITE_OTHER): Payer: Medicaid Other | Admitting: Obstetrics & Gynecology

## 2019-06-07 ENCOUNTER — Other Ambulatory Visit: Payer: Self-pay

## 2019-06-07 ENCOUNTER — Encounter: Payer: Self-pay | Admitting: Obstetrics & Gynecology

## 2019-06-07 VITALS — BP 104/56 | HR 77 | Ht 64.0 in | Wt 181.0 lb

## 2019-06-07 DIAGNOSIS — R102 Pelvic and perineal pain: Secondary | ICD-10-CM | POA: Insufficient documentation

## 2019-06-07 DIAGNOSIS — N898 Other specified noninflammatory disorders of vagina: Secondary | ICD-10-CM

## 2019-06-07 MED ORDER — DICLOFENAC SODIUM 75 MG PO TBEC: 75.0000 mg | DELAYED_RELEASE_TABLET | Freq: Two times a day (BID) | ORAL | 3 refills | Status: DC | PRN

## 2019-06-07 NOTE — Patient Instructions (Signed)
Pelvic Pain, Female Pelvic pain is pain in your lower abdomen, below your belly button and between your hips. The pain may start suddenly (be acute), keep coming back (be recurring), or last a long time (become chronic). Pelvic pain that lasts longer than 6 months is considered chronic. Pelvic pain may affect your:  Reproductive organs.  Urinary system.  Digestive tract.  Musculoskeletal system. There are many potential causes of pelvic pain. Sometimes, the pain can be a result of digestive or urinary conditions, strained muscles or ligaments, or reproductive conditions. Sometimes the cause of pelvic pain is not known. Follow these instructions at home:   Take over-the-counter and prescription medicines only as told by your health care provider.  Rest as told by your health care provider.  Do not have sex if it hurts.  Keep a journal of your pelvic pain. Write down: ? When the pain started. ? Where the pain is located. ? What seems to make the pain better or worse, such as food or your period (menstrual cycle). ? Any symptoms you have along with the pain.  Keep all follow-up visits as told by your health care provider. This is important. Contact a health care provider if:  Medicine does not help your pain.  Your pain comes back.  You have new symptoms.  You have abnormal vaginal discharge or bleeding, including bleeding after menopause.  You have a fever or chills.  You are constipated.  You have blood in your urine or stool.  You have foul-smelling urine.  You feel weak or light-headed. Get help right away if:  You have sudden severe pain.  Your pain gets steadily worse.  You have severe pain along with fever, nausea, vomiting, or excessive sweating.  You lose consciousness. Summary  Pelvic pain is pain in your lower abdomen, below your belly button and between your hips.  There are many potential causes of pelvic pain.  Keep a journal of your pelvic  pain. This information is not intended to replace advice given to you by your health care provider. Make sure you discuss any questions you have with your health care provider. Document Revised: 06/25/2017 Document Reviewed: 06/25/2017 Elsevier Patient Education  2020 [employer]  

## 2019-06-07 NOTE — Progress Notes (Signed)
Patient states she is still having same issues with IUD- ph balance issues and pain. [name] RN

## 2019-06-07 NOTE — Progress Notes (Addendum)
History:  34 y.o. [id number] here today for eval of pelvic pain. Pt reports that for the past 2 months she has had pain and dyspareunia. She has an IUD and reports a h/o freq BV. She reports that those sx have gotten much better but, now there is this pain across the lower abd. She has not taken any meds for this. She reports that this is worse with intercourse. She is married and monogamous. She reports a brownish discharge at times but, essentially has amenorrhea with the LnIUD.   Pt otherwise denies abnormal discharge. She reports a h/o ovarian cysts that resulted in pain quite like this a few years ago. The pain is not worse with eating. She has not taken any OTC meds for relief of sx. She denies any pain at present.     The following portions of the patient's history were reviewed and updated as appropriate: allergies, current medications, past family history, past medical history, past social history, past surgical history and problem list.  Review of Systems:  Pertinent items are noted in HPI.    Objective:  Physical Exam Blood pressure (!) 104/56, pulse 77, height 5\' 4"  (1.626 m), weight 181 lb (82.1 kg).  CONSTITUTIONAL: Well-developed, well-nourished female in no acute distress.  HENT:  Normocephalic, atraumatic EYES: Conjunctivae and EOM are normal. No scleral icterus.  NECK: Normal range of motion SKIN: Skin is warm and dry. No rash noted. Not diaphoretic.No pallor. [place]: Alert and oriented to person, place, and time. Normal coordination.  Abd: Soft, nontender and nondistended Pelvic: Normal appearing external genitalia; normal appearing vaginal mucosa and cervix.  Normal discharge.  Small uterus, no other palpable masses. There is mild CMT and adnexal tenderness on the right greater than the left.   Labs and Imaging 07/01/2016 CLINICAL DATA:  Patient with pelvic pain during intercourse.  EXAM: TRANSABDOMINAL AND TRANSVAGINAL ULTRASOUND OF PELVIS  TECHNIQUE: Both  transabdominal and transvaginal ultrasound examinations of the pelvis were performed. Transabdominal technique was performed for global imaging of the pelvis including uterus, ovaries, adnexal regions, and pelvic cul-de-sac. It was necessary to proceed with endovaginal exam following the transabdominal exam to visualize the endometrium and adnexal structures.  COMPARISON:  CT abdomen pelvis 01/24/2016  FINDINGS: Uterus  Measurements: 9.1 x 4.3 x 4.2 cm. There is a 1.0 x 1.0 x 1.2 cm intramural fibroid within the left aspect of the uterine fundus.  Endometrium  Thickness: 6 mm.  Intrauterine device in appropriate position.  Right ovary  Measurements: 3.3 x 2.4 x 3.0 cm. Normal appearance/no adnexal mass.  Left ovary  Measurements: 3.2 x 2.5 x 2.3 cm. Normal appearance/no adnexal mass.  Other findings  No abnormal free fluid.  IMPRESSION: No acute process.  Intrauterine device appears in appropriate position.   Assessment & Plan:  Pelvic pain of unknown etiology. Pt has LnIUD. She also reports a h/o ov cysts.   Pelvic US ordered  Diclofenac 75 mg bid prn pain  F/u in 2 weeks or sooner prn  F/u cx and Affirm  Total face-to-face time with patient was 30 min.  Greater than 50% was spent in counseling and coordination of care with the patient.   Deunte Bledsoe L. Harraway-Smith, M.D., Cherlynn June

## 2019-06-07 NOTE — Progress Notes (Signed)
Patient needed to change her pharmacy. [name] RN

## 2019-06-08 ENCOUNTER — Ambulatory Visit (HOSPITAL_BASED_OUTPATIENT_CLINIC_OR_DEPARTMENT_OTHER)
Admission: RE | Admit: 2019-06-08 | Discharge: 2019-06-08 | Disposition: A | Discharge status: Home / Self Care | Payer: Medicaid Other | Source: Ambulatory Visit | Attending: Obstetrics & Gynecology | Admitting: Obstetrics & Gynecology

## 2019-06-08 DIAGNOSIS — R102 Pelvic and perineal pain: Secondary | ICD-10-CM | POA: Diagnosis not present

## 2019-06-08 DIAGNOSIS — D251 Intramural leiomyoma of uterus: Secondary | ICD-10-CM | POA: Diagnosis not present

## 2019-06-08 LAB — CERVICOVAGINAL ANCILLARY ONLY
Bacterial Vaginitis (gardnerella): NEGATIVE
Candida Glabrata: NEGATIVE
Candida Vaginitis: NEGATIVE
Chlamydia: NEGATIVE
Comment: NEGATIVE
Comment: NEGATIVE
Comment: NEGATIVE
Comment: NEGATIVE
Comment: NEGATIVE
Comment: NORMAL
Neisseria Gonorrhea: NEGATIVE
Trichomonas: NEGATIVE

## 2019-06-09 ENCOUNTER — Other Ambulatory Visit (HOSPITAL_BASED_OUTPATIENT_CLINIC_OR_DEPARTMENT_OTHER): Payer: Medicaid Other

## 2019-06-09 DIAGNOSIS — R5383 Other fatigue: Secondary | ICD-10-CM | POA: Diagnosis not present

## 2019-06-09 DIAGNOSIS — R739 Hyperglycemia, unspecified: Secondary | ICD-10-CM | POA: Diagnosis not present

## 2019-06-09 DIAGNOSIS — E669 Obesity, unspecified: Secondary | ICD-10-CM | POA: Diagnosis not present

## 2019-06-09 DIAGNOSIS — J45909 Unspecified asthma, uncomplicated: Secondary | ICD-10-CM | POA: Diagnosis not present

## 2019-06-10 ENCOUNTER — Telehealth: Payer: Self-pay

## 2019-06-10 NOTE — Telephone Encounter (Signed)
Verbal order for Naproxen 500 mg bid 60 tabs with 1 refill was given to pt's pharmacy.  chiquita l wilson, CMA

## 2019-06-15 DIAGNOSIS — Z20822 Contact with and (suspected) exposure to covid-19: Secondary | ICD-10-CM | POA: Diagnosis not present

## 2019-06-15 DIAGNOSIS — Z1159 Encounter for screening for other viral diseases: Secondary | ICD-10-CM | POA: Diagnosis not present

## 2019-06-25 ENCOUNTER — Ambulatory Visit: Payer: Medicaid Other | Admitting: Obstetrics & Gynecology

## 2019-10-06 DIAGNOSIS — U071 COVID-19: Secondary | ICD-10-CM | POA: Diagnosis not present

## 2019-10-11 DIAGNOSIS — R079 Chest pain, unspecified: Secondary | ICD-10-CM | POA: Diagnosis not present

## 2019-10-11 DIAGNOSIS — J4541 Moderate persistent asthma with (acute) exacerbation: Secondary | ICD-10-CM | POA: Diagnosis not present

## 2019-10-11 DIAGNOSIS — R918 Other nonspecific abnormal finding of lung field: Secondary | ICD-10-CM | POA: Diagnosis not present

## 2019-10-11 DIAGNOSIS — U071 COVID-19: Secondary | ICD-10-CM | POA: Diagnosis not present

## 2019-10-11 DIAGNOSIS — Z87891 Personal history of nicotine dependence: Secondary | ICD-10-CM | POA: Diagnosis not present

## 2020-02-03 DIAGNOSIS — R1031 Right lower quadrant pain: Secondary | ICD-10-CM | POA: Diagnosis not present

## 2020-02-03 DIAGNOSIS — R109 Unspecified abdominal pain: Secondary | ICD-10-CM | POA: Diagnosis not present

## 2020-02-03 DIAGNOSIS — N3000 Acute cystitis without hematuria: Secondary | ICD-10-CM | POA: Diagnosis not present

## 2020-02-03 DIAGNOSIS — Z79899 Other long term (current) drug therapy: Secondary | ICD-10-CM | POA: Diagnosis not present

## 2020-02-03 DIAGNOSIS — Z87442 Personal history of urinary calculi: Secondary | ICD-10-CM | POA: Diagnosis not present

## 2020-02-03 DIAGNOSIS — J45909 Unspecified asthma, uncomplicated: Secondary | ICD-10-CM | POA: Diagnosis not present

## 2020-02-03 DIAGNOSIS — Z87891 Personal history of nicotine dependence: Secondary | ICD-10-CM | POA: Diagnosis not present

## 2020-02-04 ENCOUNTER — Telehealth: Payer: Self-pay

## 2020-02-04 NOTE — Telephone Encounter (Signed)
Transition Care Management Unsuccessful Follow-up Telephone Call  Date of discharge and from where:  02/03/2020 [facility]  Attempts:  1st Attempt  Reason for unsuccessful TCM follow-up call:  Left voice message

## 2020-02-07 NOTE — Telephone Encounter (Signed)
Transition Care Management Unsuccessful Follow-up Telephone Call  Date of discharge and from where: 02/03/2020 [facility]  Attempts:  2nd Attempt  Reason for unsuccessful TCM follow-up call:  Left voice message

## 2020-02-08 NOTE — Telephone Encounter (Signed)
Transition Care Management Unsuccessful Follow-up Telephone Call  Date of discharge and from where:  02/03/2020 [facility]  Attempts:  3rd Attempt  Reason for unsuccessful TCM follow-up call:  Left voice message

## 2020-04-17 DIAGNOSIS — Z20822 Contact with and (suspected) exposure to covid-19: Secondary | ICD-10-CM | POA: Diagnosis not present

## 2020-10-21 IMAGING — US US TRANSVAGINAL NON-OB
2 series · 14 of 25 positions shown · non-contrast
Comparison: 07/01/2016

CLINICAL DATA: Pelvic pain, BILATERAL adnexal pain, history of
ovarian cyst

EXAM:
ULTRASOUND PELVIS TRANSVAGINAL
TECHNIQUE: Transvaginal ultrasound examination of the pelvis was performed
including evaluation of the uterus, ovaries, adnexal regions, and
pelvic cul-de-sac.

[Series 1: us transvaginal non-ob · 13 of 28 slices shown (1 of 2)]
[im 1/28]
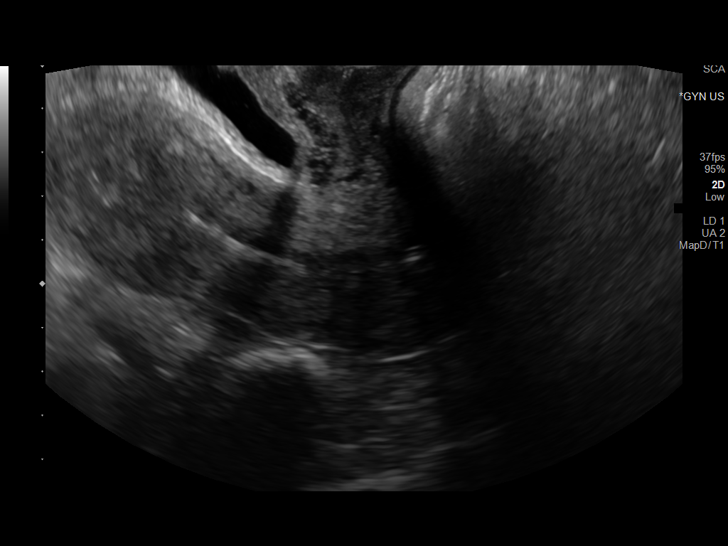
[im 3/28]
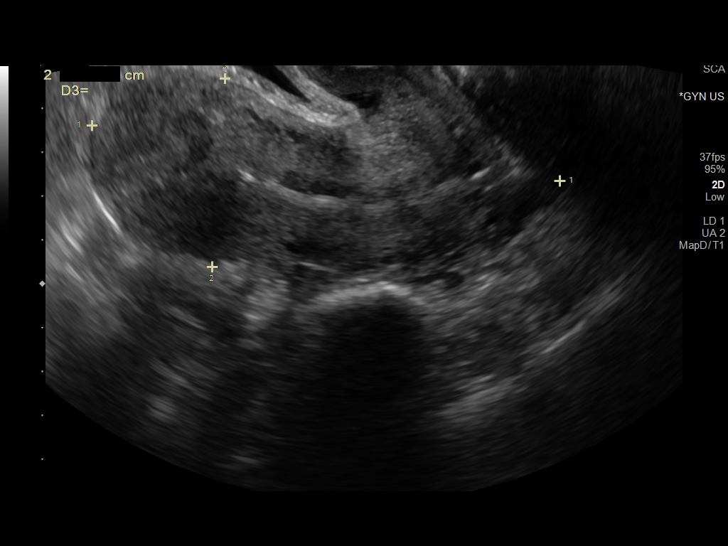
[im 5/28]
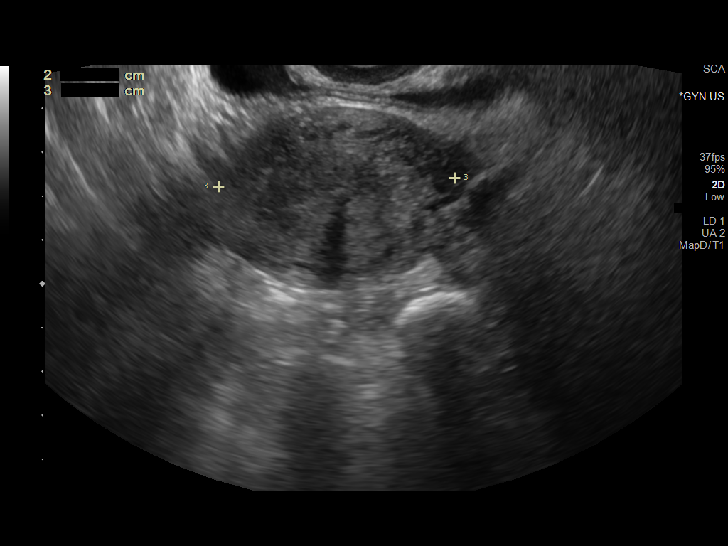
[im 8/28]
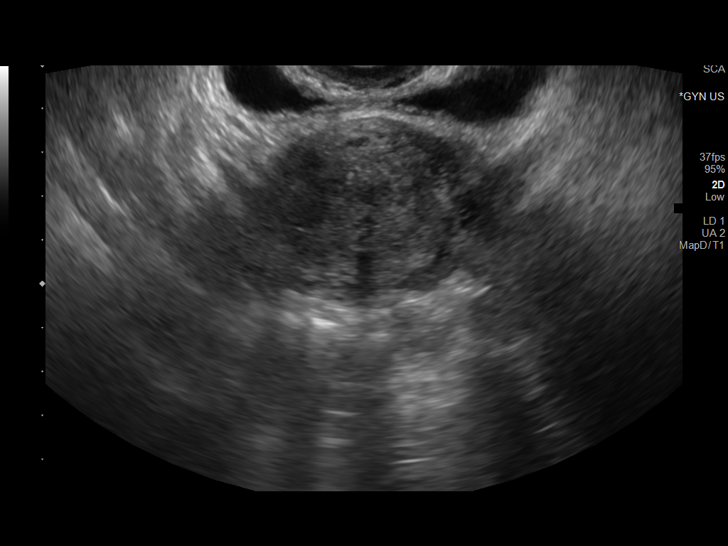
[im 10/28]
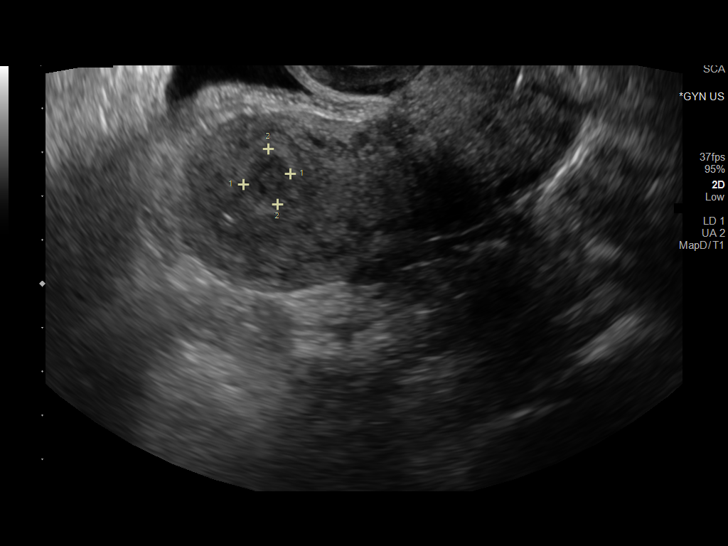
[im 11/28]
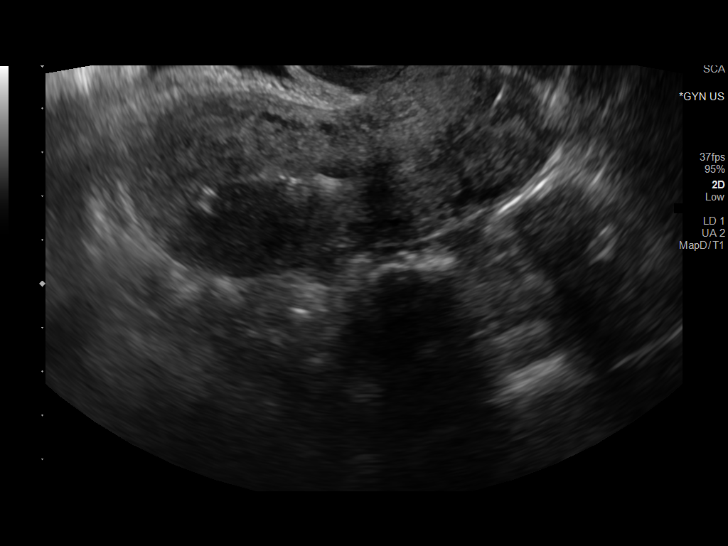
[im 13/28]
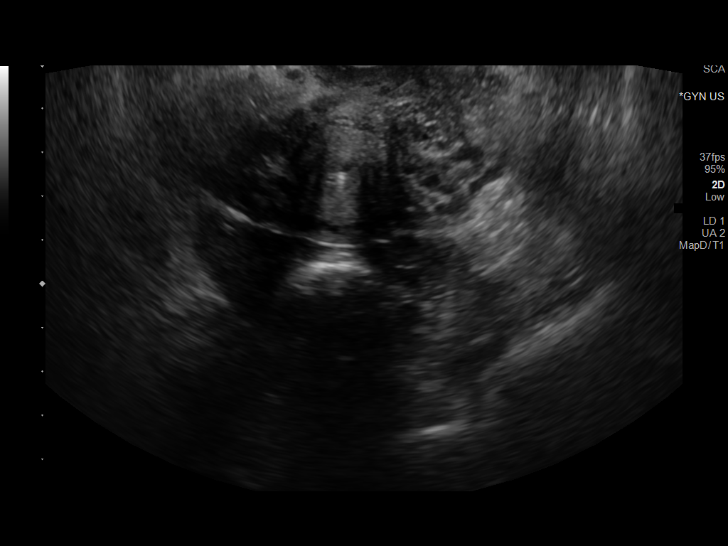
[im 16/28]
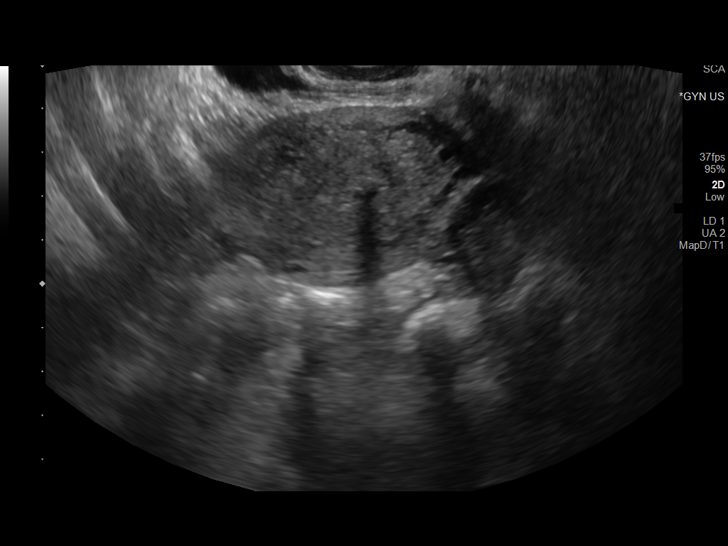
[im 18/28]
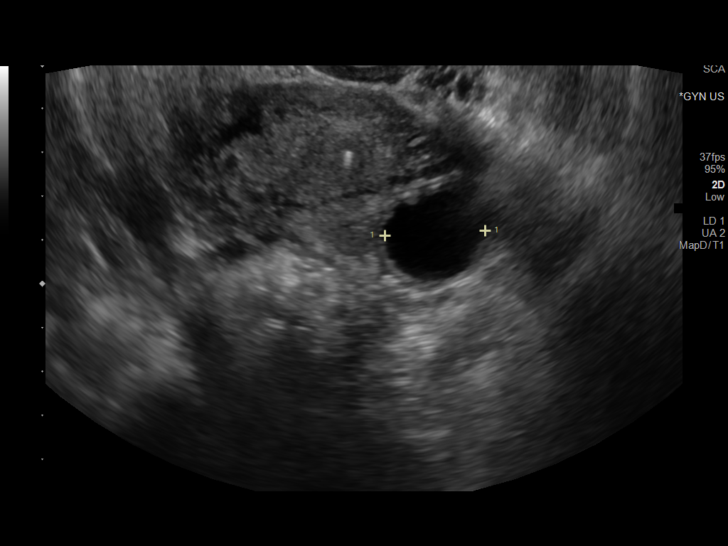
[im 19/28]
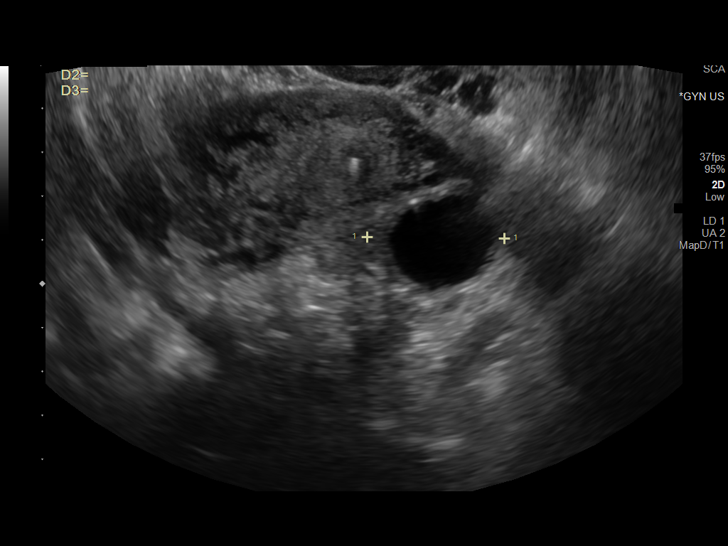
[im 22/28]
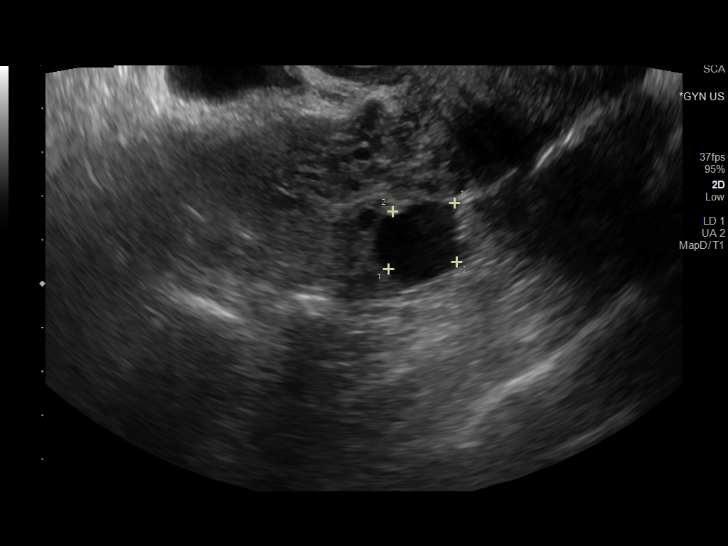
[im 24/28]
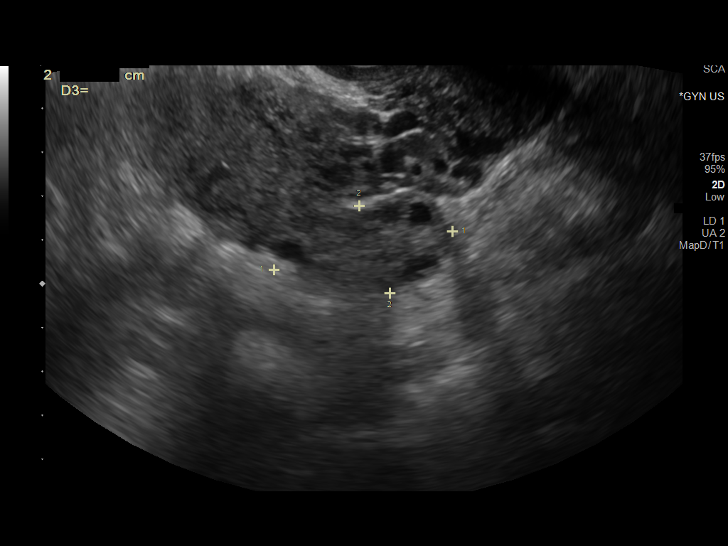
[im 26/28]
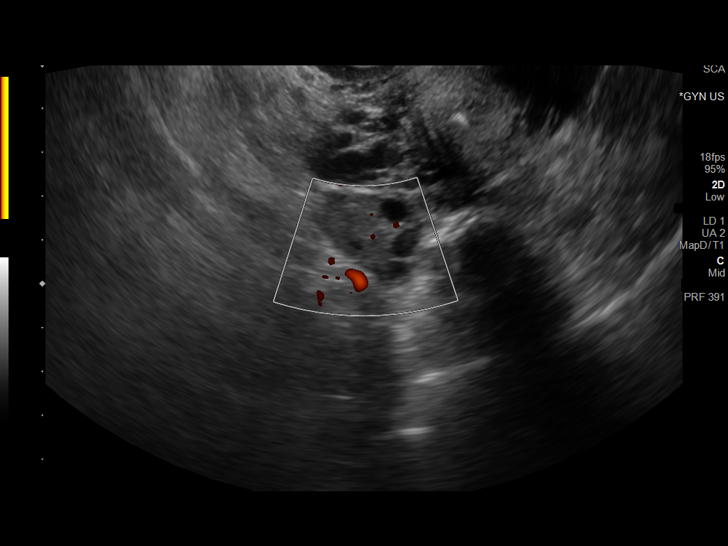

[Series 2: us transvaginal non-ob · 1 of 1 slices shown (2 of 2)]
[im 1/1]
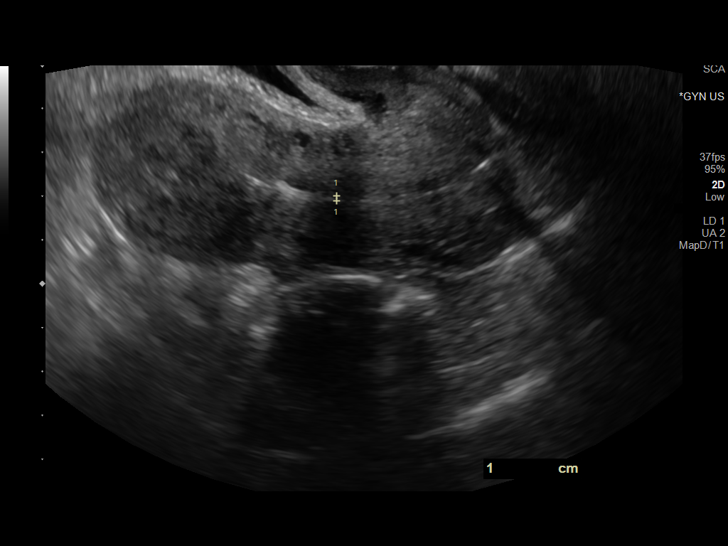

[14 of 25 positions shown; findings below may reference images not displayed]

FINDINGS: Uterus

Measurements: 10.7 x 4.3 x 5.4 cm = volume: 131 mL. Anteverted.
Heterogeneous myometrium. Small intramural leiomyoma identified at
fundus 11 x 13 x 9 mm. No additional masses.

Endometrium

Thickness: 2 mm. No endometrial fluid or focal abnormality. IUD
identified within the endometrial canal at the upper uterine
segment.

Right ovary

Measurements: 4.2 x 2.1 x 2.6 cm = volume: 12.1 mL. Normal
morphology without mass

Left ovary

Measurements: 3.5 x 2.9 x 3.1 cm = volume: 16.9 mL. Dominant
follicle without mass

Other findings:  No free pelvic fluid.  No adnexal masses.
IMPRESSION: Tiny intramural leiomyoma at fundus 13 mm diameter.

IUD within endometrial canal at upper uterine segment.

Otherwise negative exam.

## 2021-02-01 ENCOUNTER — Ambulatory Visit: Payer: Medicaid Other | Admitting: Obstetrics & Gynecology

## 2021-03-31 DIAGNOSIS — J45909 Unspecified asthma, uncomplicated: Secondary | ICD-10-CM | POA: Diagnosis not present

## 2021-03-31 DIAGNOSIS — K6389 Other specified diseases of intestine: Secondary | ICD-10-CM | POA: Diagnosis not present

## 2021-03-31 DIAGNOSIS — R109 Unspecified abdominal pain: Secondary | ICD-10-CM | POA: Diagnosis not present

## 2021-03-31 DIAGNOSIS — N3000 Acute cystitis without hematuria: Secondary | ICD-10-CM | POA: Diagnosis not present

## 2021-03-31 DIAGNOSIS — Z87442 Personal history of urinary calculi: Secondary | ICD-10-CM | POA: Diagnosis not present

## 2021-03-31 DIAGNOSIS — Z87891 Personal history of nicotine dependence: Secondary | ICD-10-CM | POA: Diagnosis not present

## 2021-03-31 DIAGNOSIS — K3580 Unspecified acute appendicitis: Secondary | ICD-10-CM | POA: Diagnosis not present

## 2021-04-01 DIAGNOSIS — K3580 Unspecified acute appendicitis: Secondary | ICD-10-CM | POA: Diagnosis not present

## 2021-04-01 DIAGNOSIS — R109 Unspecified abdominal pain: Secondary | ICD-10-CM | POA: Diagnosis not present

## 2021-04-02 ENCOUNTER — Telehealth: Payer: Self-pay

## 2021-04-02 NOTE — Telephone Encounter (Signed)
Transition Care Management Unsuccessful Follow-up Telephone Call ? ?Date of discharge and from where:  04/01/2021-[facility]  ? ?Attempts:  1st Attempt ? ?Reason for unsuccessful TCM follow-up call:  Left voice message ? ?  ?

## 2021-04-03 NOTE — Telephone Encounter (Signed)
Transition Care Management Unsuccessful Follow-up Telephone Call ? ?Date of discharge and from where:  04/01/2021-[facility]  ? ?Attempts:  2nd Attempt ? ?Reason for unsuccessful TCM follow-up call:  Left voice message ? ?  ?

## 2021-04-05 NOTE — Telephone Encounter (Signed)
Transition Care Management Unsuccessful Follow-up Telephone Call ? ?Date of discharge and from where:  04/01/2021-[facility]  ? ?Attempts:  3rd Attempt ? ?Reason for unsuccessful TCM follow-up call:  Left voice message ? ?  ?

## 2021-04-18 DIAGNOSIS — B3732 Chronic candidiasis of vulva and vagina: Secondary | ICD-10-CM | POA: Diagnosis not present

## 2021-04-18 DIAGNOSIS — Z30432 Encounter for removal of intrauterine contraceptive device: Secondary | ICD-10-CM | POA: Diagnosis not present

## 2021-04-18 DIAGNOSIS — T8389XA Other specified complication of genitourinary prosthetic devices, implants and grafts, initial encounter: Secondary | ICD-10-CM | POA: Diagnosis not present

## 2021-05-29 DIAGNOSIS — Z1159 Encounter for screening for other viral diseases: Secondary | ICD-10-CM | POA: Diagnosis not present

## 2021-05-29 DIAGNOSIS — M79604 Pain in right leg: Secondary | ICD-10-CM | POA: Diagnosis not present

## 2021-05-29 DIAGNOSIS — J4520 Mild intermittent asthma, uncomplicated: Secondary | ICD-10-CM | POA: Diagnosis not present

## 2021-05-29 DIAGNOSIS — Z0000 Encounter for general adult medical examination without abnormal findings: Secondary | ICD-10-CM | POA: Diagnosis not present

## 2021-05-29 DIAGNOSIS — R0602 Shortness of breath: Secondary | ICD-10-CM | POA: Diagnosis not present

## 2021-05-29 DIAGNOSIS — Z6834 Body mass index (BMI) 34.0-34.9, adult: Secondary | ICD-10-CM | POA: Diagnosis not present

## 2021-05-29 DIAGNOSIS — Z7185 Encounter for immunization safety counseling: Secondary | ICD-10-CM | POA: Diagnosis not present

## 2021-05-29 DIAGNOSIS — E8881 Metabolic syndrome: Secondary | ICD-10-CM | POA: Diagnosis not present

## 2021-05-29 DIAGNOSIS — Z20822 Contact with and (suspected) exposure to covid-19: Secondary | ICD-10-CM | POA: Diagnosis not present

## 2021-06-14 DIAGNOSIS — M79605 Pain in left leg: Secondary | ICD-10-CM | POA: Diagnosis not present

## 2021-06-14 DIAGNOSIS — M79604 Pain in right leg: Secondary | ICD-10-CM | POA: Diagnosis not present

## 2021-06-17 DIAGNOSIS — R509 Fever, unspecified: Secondary | ICD-10-CM | POA: Diagnosis not present

## 2021-06-17 DIAGNOSIS — Z20822 Contact with and (suspected) exposure to covid-19: Secondary | ICD-10-CM | POA: Diagnosis not present

## 2021-07-17 DIAGNOSIS — Z79899 Other long term (current) drug therapy: Secondary | ICD-10-CM | POA: Diagnosis not present

## 2021-07-17 DIAGNOSIS — J4520 Mild intermittent asthma, uncomplicated: Secondary | ICD-10-CM | POA: Diagnosis not present

## 2021-07-17 DIAGNOSIS — R7303 Prediabetes: Secondary | ICD-10-CM | POA: Diagnosis not present

## 2021-07-17 DIAGNOSIS — R5383 Other fatigue: Secondary | ICD-10-CM | POA: Diagnosis not present

## 2021-07-17 DIAGNOSIS — E559 Vitamin D deficiency, unspecified: Secondary | ICD-10-CM | POA: Diagnosis not present

## 2021-07-17 DIAGNOSIS — D539 Nutritional anemia, unspecified: Secondary | ICD-10-CM | POA: Diagnosis not present

## 2021-07-17 DIAGNOSIS — E7800 Pure hypercholesterolemia, unspecified: Secondary | ICD-10-CM | POA: Diagnosis not present

## 2021-07-17 DIAGNOSIS — Z6833 Body mass index (BMI) 33.0-33.9, adult: Secondary | ICD-10-CM | POA: Diagnosis not present

## 2021-07-17 DIAGNOSIS — G4719 Other hypersomnia: Secondary | ICD-10-CM | POA: Diagnosis not present

## 2021-07-17 DIAGNOSIS — Z7185 Encounter for immunization safety counseling: Secondary | ICD-10-CM | POA: Diagnosis not present

## 2021-08-19 DIAGNOSIS — Z7185 Encounter for immunization safety counseling: Secondary | ICD-10-CM | POA: Diagnosis not present

## 2021-08-19 DIAGNOSIS — Z3041 Encounter for surveillance of contraceptive pills: Secondary | ICD-10-CM | POA: Diagnosis not present

## 2021-08-19 DIAGNOSIS — R7303 Prediabetes: Secondary | ICD-10-CM | POA: Diagnosis not present

## 2021-08-19 DIAGNOSIS — N209 Urinary calculus, unspecified: Secondary | ICD-10-CM | POA: Diagnosis not present

## 2021-08-19 DIAGNOSIS — Z6832 Body mass index (BMI) 32.0-32.9, adult: Secondary | ICD-10-CM | POA: Diagnosis not present

## 2021-08-19 DIAGNOSIS — E7800 Pure hypercholesterolemia, unspecified: Secondary | ICD-10-CM | POA: Diagnosis not present

## 2021-08-19 DIAGNOSIS — J4520 Mild intermittent asthma, uncomplicated: Secondary | ICD-10-CM | POA: Diagnosis not present

## 2021-08-19 DIAGNOSIS — Z79899 Other long term (current) drug therapy: Secondary | ICD-10-CM | POA: Diagnosis not present

## 2021-08-19 DIAGNOSIS — R609 Edema, unspecified: Secondary | ICD-10-CM | POA: Diagnosis not present

## 2021-09-06 DIAGNOSIS — E669 Obesity, unspecified: Secondary | ICD-10-CM | POA: Diagnosis not present

## 2021-09-06 DIAGNOSIS — J4520 Mild intermittent asthma, uncomplicated: Secondary | ICD-10-CM | POA: Diagnosis not present

## 2021-09-06 DIAGNOSIS — Z114 Encounter for screening for human immunodeficiency virus [HIV]: Secondary | ICD-10-CM | POA: Diagnosis not present

## 2021-09-06 DIAGNOSIS — Z79899 Other long term (current) drug therapy: Secondary | ICD-10-CM | POA: Diagnosis not present

## 2021-09-06 DIAGNOSIS — E7800 Pure hypercholesterolemia, unspecified: Secondary | ICD-10-CM | POA: Diagnosis not present

## 2021-09-06 DIAGNOSIS — Z7185 Encounter for immunization safety counseling: Secondary | ICD-10-CM | POA: Diagnosis not present

## 2021-09-06 DIAGNOSIS — N209 Urinary calculus, unspecified: Secondary | ICD-10-CM | POA: Diagnosis not present

## 2021-09-06 DIAGNOSIS — R7303 Prediabetes: Secondary | ICD-10-CM | POA: Diagnosis not present

## 2021-09-06 DIAGNOSIS — R609 Edema, unspecified: Secondary | ICD-10-CM | POA: Diagnosis not present

## 2021-09-06 DIAGNOSIS — B3731 Acute candidiasis of vulva and vagina: Secondary | ICD-10-CM | POA: Diagnosis not present

## 2021-09-06 DIAGNOSIS — Z6832 Body mass index (BMI) 32.0-32.9, adult: Secondary | ICD-10-CM | POA: Diagnosis not present

## 2021-09-06 DIAGNOSIS — Z7251 High risk heterosexual behavior: Secondary | ICD-10-CM | POA: Diagnosis not present

## 2021-12-11 DIAGNOSIS — Z6833 Body mass index (BMI) 33.0-33.9, adult: Secondary | ICD-10-CM | POA: Diagnosis not present

## 2021-12-11 DIAGNOSIS — Z20818 Contact with and (suspected) exposure to other bacterial communicable diseases: Secondary | ICD-10-CM | POA: Diagnosis not present

## 2021-12-11 DIAGNOSIS — J020 Streptococcal pharyngitis: Secondary | ICD-10-CM | POA: Diagnosis not present

## 2021-12-11 DIAGNOSIS — E669 Obesity, unspecified: Secondary | ICD-10-CM | POA: Diagnosis not present

## 2021-12-11 DIAGNOSIS — J029 Acute pharyngitis, unspecified: Secondary | ICD-10-CM | POA: Diagnosis not present

## 2022-07-30 ENCOUNTER — Ambulatory Visit
Admission: EM | Admit: 2022-07-30 | Discharge: 2022-07-30 | Disposition: A | Discharge status: Home / Self Care | Payer: 59 | Attending: Internal Medicine | Admitting: Internal Medicine

## 2022-07-30 ENCOUNTER — Telehealth: Payer: Self-pay

## 2022-07-30 DIAGNOSIS — N760 Acute vaginitis: Secondary | ICD-10-CM | POA: Insufficient documentation

## 2022-07-30 MED ORDER — CLINDAMYCIN HCL 300 MG PO CAPS: 300.0000 mg | ORAL_CAPSULE | Freq: Two times a day (BID) | ORAL | 0 refills | Status: DC

## 2022-07-30 MED ORDER — CLINDAMYCIN HCL 300 MG PO CAPS
300.00 mg | ORAL_CAPSULE | Freq: Two times a day (BID) | ORAL | 0 refills | Status: AC
Start: 2022-07-30 — End: 2022-08-06

## 2022-07-30 NOTE — ED Triage Notes (Signed)
Pt presents to UC w/ c/o vaginal discharge, fishy smell x8 days.  Tried fluconazole 3 days did not help.  Hx chlamydia in the past with same symptoms.

## 2022-07-30 NOTE — Discharge Instructions (Signed)
Start clindamycin twice daily for 7 days.  The clinic will contact you with results of the testing done today if positive.  Please follow-up with your PCP or your gynecologist if symptoms do not improve.  Please go to the ER for any worsening symptoms.  I hope you feel better soon!

## 2022-07-30 NOTE — ED Provider Notes (Signed)
[facility]    CSN: [id number] Arrival date & time: 07/30/22  1712      History   Chief Complaint No chief complaint on file.   HPI April Blanchard is a 37 y.o. female presents for evaluation of vaginal discharge.  Patient reports 1 week of a fishy smelling vaginal discharge.  She denies any dysuria, fevers, nausea/vomiting, flank pain.  Had some itching initially but this is since resolved.  No STD exposure but she would like treatment.  She reports she has a history of reoccurring BV infections and Flagyl no longer works for her.  She did take 3 doses of fluconazole that she had from a previous infection.  She also reports a history of chlamydia with similar symptoms.  No other concerns at this time.  HPI  Past Medical History:  Diagnosis Date   Anemia    Asthma    albuterol i month ago   Palpitations     Patient Active Problem List   Diagnosis Date Noted   Vaginal discharge 08/24/2018   Apnea 12/19/2016   Moderate persistent asthma 02/29/2016   Perennial and seasonal allergic rhinitis 02/29/2016   Allergic conjunctivitis 02/29/2016   Pruritus/urticaria 02/29/2016    Past Surgical History:  Procedure Laterality Date   TONSILLECTOMY      OB History     Gravida  4   Para  4   Term  4   Preterm  0   AB  0   Living  4      SAB  0   IAB  0   Ectopic  0   Multiple  0   Live Births  4            Home Medications    Prior to Admission medications   Medication Sig Start Date End Date Taking? Authorizing Provider  clindamycin (CLEOCIN) 300 MG capsule Take 1 capsule (300 mg total) by mouth 2 (two) times daily for 7 days. 07/30/22 08/06/22 Yes Radford Pax, NP  albuterol (PROAIR HFA) 108 (90 Base) MCG/ACT inhaler Inhale 2 puffs into the lungs every 6 (six) hours as needed for wheezing or shortness of breath.    [provider]  ALPRAZolam Prudy Feeler) 0.5 MG tablet Take by mouth as needed.     [provider]  beclomethasone (QVAR)  80 MCG/ACT inhaler Inhale 1 puff into the lungs as needed.    [provider]  Boric Acid [facility] 600 mg vaginally at bedtime. Use vaginally every night for 21 days. DO NOT TAKE BY MOUTH 10/23/18   Willodean Rosenthal, MD  cetirizine (ZYRTEC) 10 MG tablet Take 10 mg by mouth daily.    [provider]  diclofenac (VOLTAREN) 75 MG EC tablet Take 1 tablet (75 mg total) by mouth 2 (two) times daily as needed. 06/07/19   Willodean Rosenthal, MD  EPINEPHrine (EPIPEN 2-PAK) 0.3 mg/0.3 mL IJ SOAJ injection Use as directed for severe allergic reaction. Patient not taking: Reported on 10/23/2018 12/19/16   Bobbitt, Heywood Iles, MD  fexofenadine (ALLEGRA) 180 MG tablet Take 1 tablet (180 mg total) by mouth daily. Patient not taking: Reported on 10/23/2018 12/19/16   Bobbitt, Heywood Iles, MD  fluticasone [facility]) 50 MCG/ACT nasal spray Place 2 sprays into both nostrils daily. Patient not taking: Reported on 10/23/2018 02/29/16   Bobbitt, Heywood Iles, MD  levonorgestrel (MIRENA, 52 MG,) 20 MCG/24HR IUD Mirena 20 mcg/24 hr (5 years) intrauterine device  Take by intrauterine route. 06/21/16  [provider]  montelukast (SINGULAIR) 10 MG tablet Take 1 tablet (10 mg total) by mouth at bedtime. Patient not taking: Reported on 07/01/2016 02/29/16   Bobbitt, Heywood Iles, MD  olopatadine (PATANOL) 0.1 % ophthalmic solution Place 1 drop into both eyes daily as needed for allergies. Patient not taking: Reported on 07/01/2016 02/29/16   Cristal Ford, MD  ranitidine (ZANTAC) 150 MG tablet 1 tablet twice daily Patient not taking: Reported on 10/23/2018 12/19/16   Bobbitt, Heywood Iles, MD    Family History Family History  Problem Relation Age of Onset   Diabetes Mother    Asthma Mother    Asthma Brother    Asthma Maternal Uncle    Diabetes Maternal Grandmother    Asthma Father    Other Neg Hx     Social History Social History   Tobacco Use   Smoking status: Never    Smokeless tobacco: Never  Vaping Use   Vaping Use: Never used  Substance Use Topics   Alcohol use: No   Drug use: No     Allergies   Patient has no known allergies.   Review of Systems Review of Systems  Genitourinary:  Positive for vaginal discharge.     Physical Exam Triage Vital Signs ED Triage Vitals  Enc Vitals Group     BP 07/30/22 1725 109/72     Pulse Rate 07/30/22 1725 87     Resp 07/30/22 1725 16     Temp 07/30/22 1725 98.5 F (36.9 C)     Temp Source 07/30/22 1725 Oral     SpO2 07/30/22 1725 96 %     Weight --      Height --      Head Circumference --      Peak Flow --      Pain Score 07/30/22 1724 0     Pain Loc --      Pain Edu? --      Excl. in GC? --    No data found.  Updated Vital Signs BP 109/72 (BP Location: Left Arm)   Pulse 87   Temp 98.5 F (36.9 C) (Oral)   Resp 16   LMP 07/22/2022 (Approximate)   SpO2 96%   Visual Acuity Right Eye Distance:   Left Eye Distance:   Bilateral Distance:    Right Eye Near:   Left Eye Near:    Bilateral Near:     Physical Exam Vitals and nursing note reviewed.  Constitutional:      Appearance: Normal appearance.  HENT:     Head: Normocephalic and atraumatic.  Eyes:     Pupils: Pupils are equal, round, and reactive to light.  Cardiovascular:     Rate and Rhythm: Normal rate.  Pulmonary:     Effort: Pulmonary effort is normal.  Abdominal:     Tenderness: There is no right CVA tenderness or left CVA tenderness.  Skin:    General: Skin is warm and dry.  Neurological:     General: No focal deficit present.     Mental Status: She is alert and oriented to person, place, and time.  Psychiatric:        Mood and Affect: Mood normal.        Behavior: Behavior normal.      UC Treatments / Results  Labs (all labs ordered are listed, but only abnormal results are displayed) Labs Reviewed  CERVICOVAGINAL ANCILLARY ONLY    EKG   Radiology No results found.  Procedures Procedures  (including critical care time)  Medications Ordered in UC Medications - No data to display  Initial Impression / Assessment and Plan / UC Course  I have reviewed the triage vital signs and the nursing notes.  Pertinent labs & imaging results that were available during my care of the patient were reviewed by me and considered in my medical decision making (see chart for details).     Symptoms and exam with patient.  No red flags.  STD testing is ordered and will contact for any positive results.  She declined blood work.  Will start clindamycin for BV as she states the Flagyl no longer works for her.  Additional treatment pending results.  Patient to follow-up with PCP or GYN if symptoms do not improve.  ER precautions reviewed and patient verbalized understanding Final Clinical Impressions(s) / UC Diagnoses   Final diagnoses:  Acute vaginitis     Discharge Instructions      Start clindamycin twice daily for 7 days.  The clinic will contact you with results of the testing done today if positive.  Please follow-up with your PCP or your gynecologist if symptoms do not improve.  Please go to the ER for any worsening symptoms.  I hope you feel better soon!   ED Prescriptions     Medication Sig Dispense Auth. Provider   clindamycin (CLEOCIN) 300 MG capsule Take 1 capsule (300 mg total) by mouth 2 (two) times daily for 7 days. 14 capsule Radford Pax, NP      PDMP not reviewed this encounter.   Radford Pax, NP 07/30/22 [phone]

## 2022-07-30 NOTE — Telephone Encounter (Signed)
Pt called requesting to have the pharmacy changed.

## 2022-07-31 LAB — CERVICOVAGINAL ANCILLARY ONLY
Bacterial Vaginitis (gardnerella): POSITIVE — AB
Candida Glabrata: NEGATIVE
Candida Vaginitis: NEGATIVE
Chlamydia: NEGATIVE
Comment: NEGATIVE
Comment: NEGATIVE
Comment: NEGATIVE
Comment: NEGATIVE
Comment: NEGATIVE
Comment: NORMAL
Neisseria Gonorrhea: NEGATIVE
Trichomonas: NEGATIVE

## 2022-08-02 ENCOUNTER — Telehealth: Payer: Self-pay

## 2022-08-02 NOTE — Telephone Encounter (Signed)
Pt called for test results of cervicovaginal swab. Pt was informed of results and that clindamycin will cover bacterial vaginitis. Pt was understanding.

## 2023-02-22 DIAGNOSIS — K529 Noninfective gastroenteritis and colitis, unspecified: Secondary | ICD-10-CM | POA: Diagnosis not present

## 2023-05-14 ENCOUNTER — Emergency Department (HOSPITAL_COMMUNITY)
Admission: EM | Admit: 2023-05-14 | Discharge: 2023-05-14 | Disposition: A | Discharge status: Home / Self Care | Attending: Emergency Medicine | Admitting: Emergency Medicine

## 2023-05-14 ENCOUNTER — Emergency Department (HOSPITAL_COMMUNITY)

## 2023-05-14 ENCOUNTER — Encounter (HOSPITAL_COMMUNITY): Payer: Self-pay

## 2023-05-14 ENCOUNTER — Other Ambulatory Visit: Payer: Self-pay

## 2023-05-14 DIAGNOSIS — N83209 Unspecified ovarian cyst, unspecified side: Secondary | ICD-10-CM | POA: Insufficient documentation

## 2023-05-14 DIAGNOSIS — R102 Pelvic and perineal pain: Secondary | ICD-10-CM | POA: Diagnosis present

## 2023-05-14 DIAGNOSIS — R109 Unspecified abdominal pain: Secondary | ICD-10-CM | POA: Diagnosis not present

## 2023-05-14 LAB — CBC WITH DIFFERENTIAL/PLATELET
Abs Immature Granulocytes: 0.03 10*3/uL (ref 0.00–0.07)
Basophils Absolute: 0 10*3/uL (ref 0.0–0.1)
Basophils Relative: 0 %
Eosinophils Absolute: 0.1 10*3/uL (ref 0.0–0.5)
Eosinophils Relative: 2 %
HCT: 38.8 % (ref 36.0–46.0)
Hemoglobin: 12 g/dL (ref 12.0–15.0)
Immature Granulocytes: 0 %
Lymphocytes Relative: 7 %
Lymphs Abs: 0.6 10*3/uL — ABNORMAL LOW (ref 0.7–4.0)
MCH: 27.5 pg (ref 26.0–34.0)
MCHC: 30.9 g/dL (ref 30.0–36.0)
MCV: 88.8 fL (ref 80.0–100.0)
Monocytes Absolute: 0.4 10*3/uL (ref 0.1–1.0)
Monocytes Relative: 5 %
Neutro Abs: 7 10*3/uL (ref 1.7–7.7)
Neutrophils Relative %: 86 %
Platelets: 326 10*3/uL (ref 150–400)
RBC: 4.37 MIL/uL (ref 3.87–5.11)
RDW: 13.3 % (ref 11.5–15.5)
WBC: 8.2 10*3/uL (ref 4.0–10.5)
nRBC: 0 % (ref 0.0–0.2)

## 2023-05-14 LAB — COMPREHENSIVE METABOLIC PANEL WITH GFR
ALT: 20 U/L (ref 0–44)
AST: 21 U/L (ref 15–41)
Albumin: 3.6 g/dL (ref 3.5–5.0)
Alkaline Phosphatase: 47 U/L (ref 38–126)
Anion gap: 10 (ref 5–15)
BUN: 17 mg/dL (ref 6–20)
CO2: 23 mmol/L (ref 22–32)
Calcium: 8.5 mg/dL — ABNORMAL LOW (ref 8.9–10.3)
Chloride: 106 mmol/L (ref 98–111)
Creatinine, Ser: 0.52 mg/dL (ref 0.44–1.00)
GFR, Estimated: >60 mL/min (ref >60)
Glucose, Bld: 102 mg/dL — ABNORMAL HIGH (ref 70–99)
Potassium: 3.9 mmol/L (ref 3.5–5.1)
Sodium: 139 mmol/L (ref 135–145)
Total Bilirubin: 0.7 mg/dL (ref 0.0–1.2)
Total Protein: 6.5 g/dL (ref 6.5–8.1)

## 2023-05-14 LAB — URINALYSIS, ROUTINE W REFLEX MICROSCOPIC
Bacteria, UA: NONE SEEN
Bilirubin Urine: NEGATIVE
Glucose, UA: NEGATIVE mg/dL
Hgb urine dipstick: NEGATIVE
Ketones, ur: NEGATIVE mg/dL
Nitrite: NEGATIVE
Protein, ur: 30 mg/dL — AB
Specific Gravity, Urine: 1.019 (ref 1.005–1.030)
pH: 8 (ref 5.0–8.0)

## 2023-05-14 LAB — HCG, SERUM, QUALITATIVE: Preg, Serum: NEGATIVE

## 2023-05-14 LAB — RESP PANEL BY RT-PCR (RSV, FLU A&B, COVID)  RVPGX2
Influenza A by PCR: NEGATIVE
Influenza B by PCR: NEGATIVE
Resp Syncytial Virus by PCR: NEGATIVE
SARS Coronavirus 2 by RT PCR: NEGATIVE

## 2023-05-14 LAB — PREGNANCY, URINE: Preg Test, Ur: NEGATIVE

## 2023-05-14 MED ORDER — PROMETHAZINE HCL 25 MG/ML IJ SOLN
12.5000 mg | Freq: Four times a day (QID) | INTRAMUSCULAR | Status: DC | PRN
  Administered 2023-05-14: 12.5 mg via INTRAVENOUS
  Filled 2023-05-14: qty 12.5

## 2023-05-14 MED ORDER — FUROSEMIDE 20 MG PO TABS: 20.0000 mg | ORAL_TABLET | Freq: Every day | ORAL | 0 refills | Status: DC

## 2023-05-14 MED ORDER — KETOROLAC TROMETHAMINE 30 MG/ML IJ SOLN
15.0000 mg | Freq: Once | INTRAMUSCULAR | Status: AC
  Administered 2023-05-14: 15 mg via INTRAVENOUS
  Filled 2023-05-14: qty 1

## 2023-05-14 MED ORDER — ONDANSETRON 4 MG PO TBDP
4.0000 mg | ORAL_TABLET | Freq: Once | ORAL | Status: AC
  Administered 2023-05-14: 4 mg via ORAL
  Filled 2023-05-14: qty 1

## 2023-05-14 MED ORDER — MORPHINE SULFATE (PF) 4 MG/ML IV SOLN
4.0000 mg | Freq: Once | INTRAVENOUS | Status: AC
  Administered 2023-05-14: 4 mg via INTRAVENOUS
  Filled 2023-05-14: qty 1

## 2023-05-14 MED ORDER — HYDROCODONE-ACETAMINOPHEN 5-325 MG PO TABS: 1.0000 | ORAL_TABLET | Freq: Four times a day (QID) | ORAL | 0 refills | Status: AC | PRN

## 2023-05-14 MED ORDER — SODIUM CHLORIDE 0.9 % IV BOLUS
500.0000 mL | Freq: Once | INTRAVENOUS | Status: AC
  Administered 2023-05-14: 500 mL via INTRAVENOUS

## 2023-05-14 MED ORDER — ONDANSETRON HCL 4 MG/2ML IJ SOLN
4.0000 mg | Freq: Once | INTRAMUSCULAR | Status: AC
  Administered 2023-05-14: 4 mg via INTRAVENOUS
  Filled 2023-05-14: qty 2

## 2023-05-14 NOTE — ED Triage Notes (Signed)
 PT BIB [facility] coming from home. Dx UTI a week ago. Finished both antibiotics yesterday. Left lower flank pain bilateral flank pain now. Blood in urine. Hx of kidney stones. Pt says is feels like previous kidney stones.

## 2023-05-14 NOTE — ED Notes (Signed)
 Patient's daughter would like an update on patient care. Phone number is in the chart.

## 2023-05-14 NOTE — Discharge Instructions (Addendum)
 You have been seen and discharged from the emergency department.  Your evaluation today shows what is most likely a uterine fibroid as well as a complex right ovarian cyst.  Take medication for pain control.  Establish care with OB/GYN for further follow-up and treatment recommendation.  Follow-up with your primary provider for further evaluation and further care. Take home medications as prescribed. If you have any worsening symptoms or further concerns for your health please return to an emergency department for further evaluation.

## 2023-05-14 NOTE — ED Provider Notes (Signed)
 [place] EMERGENCY DEPARTMENT AT [facility] Provider Note   CSN: [id number] Arrival date & time: 05/14/23  0930     History  Chief Complaint  Patient presents with   Flank Pain    April Blanchard is a 38 y.o. female.  HPI   38 year old female presents emergency department with bilateral lower back/flank pain, hematuria, intermittent suprapubic/vaginal pain.  Patient states that she has history of kidney stones, this feels familiar.  A little over a week ago patient had dysuria and vaginal discharge with a fishy odor.  She was treated for UTI and bacterial vaginosis as an outpatient.  States that the vaginal discharge/odor and dysuria resolved however she continues to have lower back/flank pain with some blood in urine.  Denies any fever, nausea/vomiting, diarrhea.  Denies any ongoing vaginal bleeding, discharge or odor.  Home Medications Prior to Admission medications   Medication Sig Start Date End Date Taking? Authorizing Provider  albuterol  (PROAIR  HFA) 108 (90 Base) MCG/ACT inhaler Inhale 2 puffs into the lungs every 6 (six) hours as needed for wheezing or shortness of breath.    [provider]  ALPRAZolam (XANAX) 0.5 MG tablet Take by mouth as needed.     [provider]  beclomethasone (QVAR) 80 MCG/ACT inhaler Inhale 1 puff into the lungs as needed.    [provider]  Boric Acid CRYS Place 600 mg vaginally at bedtime. Use vaginally every night for 21 days. DO NOT TAKE BY MOUTH 10/23/18   Lenord Radon, MD  cetirizine (ZYRTEC) 10 MG tablet Take 10 mg by mouth daily.    [provider]  diclofenac  (VOLTAREN ) 75 MG EC tablet Take 1 tablet (75 mg total) by mouth 2 (two) times daily as needed. 06/07/19   Lenord Radon, MD  EPINEPHrine  (EPIPEN  2-PAK) 0.3 mg/0.3 mL IJ SOAJ injection Use as directed for severe allergic reaction. Patient not taking: Reported on 10/23/2018 12/19/16   Bobbitt, Colen Daunt, MD  fexofenadine   (ALLEGRA ) 180 MG tablet Take 1 tablet (180 mg total) by mouth daily. Patient not taking: Reported on 10/23/2018 12/19/16   Bobbitt, Colen Daunt, MD  fluticasone  (FLONASE ) 50 MCG/ACT nasal spray Place 2 sprays into both nostrils daily. Patient not taking: Reported on 10/23/2018 02/29/16   Bobbitt, Colen Daunt, MD  levonorgestrel  (MIRENA , 52 MG,) 20 MCG/24HR IUD Mirena  20 mcg/24 hr (5 years) intrauterine device  Take by intrauterine route. 06/21/16   [provider]  montelukast  (SINGULAIR ) 10 MG tablet Take 1 tablet (10 mg total) by mouth at bedtime. Patient not taking: Reported on 07/01/2016 02/29/16   Bobbitt, Colen Daunt, MD  olopatadine  (PATANOL) 0.1 % ophthalmic solution Place 1 drop into both eyes daily as needed for allergies. Patient not taking: Reported on 07/01/2016 02/29/16   Carlton Chick, MD  ranitidine  (ZANTAC ) 150 MG tablet 1 tablet twice daily Patient not taking: Reported on 10/23/2018 12/19/16   Bobbitt, Colen Daunt, MD      Allergies    Patient has no known allergies.    Review of Systems   Review of Systems  Constitutional:  Positive for fatigue. Negative for fever.  Respiratory:  Negative for shortness of breath.   Cardiovascular:  Negative for chest pain.  Gastrointestinal:  Negative for abdominal pain, diarrhea and vomiting.  Genitourinary:  Positive for dysuria, flank pain, hematuria and vaginal pain. Negative for difficulty urinating, vaginal bleeding and vaginal discharge.  Musculoskeletal:  Positive for back pain.  Skin:  Negative for rash.  Neurological:  Negative  for headaches.    Physical Exam Updated Vital Signs BP 119/78   Pulse 91   Temp 98.3 F (36.8 C) (Oral)   Resp 16   Ht 5\' 4"  (1.626 m)   Wt 82.1 kg   SpO2 98%   BMI 31.07 kg/m  Physical Exam Vitals and nursing note reviewed.  Constitutional:      General: She is not in acute distress.    Appearance: Normal appearance.  HENT:     Head: Normocephalic.     Mouth/Throat:      Mouth: Mucous membranes are moist.  Cardiovascular:     Rate and Rhythm: Normal rate.  Pulmonary:     Effort: Pulmonary effort is normal. No respiratory distress.  Abdominal:     Palpations: Abdomen is soft.     Comments: Mild suprapubic and bilateral lower back/flank pain  Skin:    General: Skin is warm.  Neurological:     Mental Status: She is alert and oriented to person, place, and time. Mental status is at baseline.  Psychiatric:        Mood and Affect: Mood normal.     ED Results / Procedures / Treatments   Labs (all labs ordered are listed, but only abnormal results are displayed) Labs Reviewed  CBC WITH DIFFERENTIAL/PLATELET - Abnormal; Notable for the following components:      Result Value   Lymphs Abs 0.6 (*)    All other components within normal limits  COMPREHENSIVE METABOLIC PANEL WITH GFR  URINALYSIS, ROUTINE W REFLEX MICROSCOPIC  PREGNANCY, URINE  HCG, SERUM, QUALITATIVE    EKG None  Radiology No results found.  Procedures Procedures    Medications Ordered in ED Medications  ondansetron  (ZOFRAN -ODT) disintegrating tablet 4 mg (4 mg Oral Given 05/14/23 0944)  sodium chloride  0.9 % bolus 500 mL (500 mLs Intravenous New Bag/Given 05/14/23 1011)  ondansetron  (ZOFRAN ) injection 4 mg (4 mg Intravenous Given 05/14/23 1011)    ED Course/ Medical Decision Making/ A&P                                 Medical Decision Making Amount and/or Complexity of Data Reviewed Labs: ordered. Radiology: ordered.  Risk Prescription drug management.   38 year old female presents emergency department with lower back pain and bilateral flank pain.  Patient states she recently completed treatment for UTI and ED with improvement of discharge/dysuria but has ongoing pain.  States it feels her previous kidney stones.  Vitals are normal and stable.  Abdomen is benign.  She does have lower back/flank pain.  Urinalysis shows no findings of UTI.  Blood work is otherwise  reassuring, pregnancy test is negative.  CT abdominal study shows no acute finding.  On reevaluation even after pain medicine patient complains of ongoing worsening right lower back pain with some radiation to her pelvis.  Continues to deny any vaginal bleeding/discharge/swelling.  Pelvic not indicated at this time.  Ultrasound ordered.  Ultrasound identifies a possible uterine fibroid as well as a complex right-sided ovarian cyst.  This could be the source of her discomfort.  She is otherwise feeling well and will plan to establish outpatient care with OB/GYN for further evaluation and treatment.  Of note: Patient was supposed to be on a short low-dose course of Lasix  for lower extremity edema which she says she lost.  She has requested a small prescription of this which I can provide.  Patient at this  time appears safe and stable for discharge and close outpatient follow up. Discharge plan and strict return to ED precautions discussed, patient verbalizes understanding and agreement.        Final Clinical Impression(s) / ED Diagnoses Final diagnoses:  None    Rx / DC Orders ED Discharge Orders     None         Flonnie Humphrey, DO 05/14/23 1527

## 2023-06-17 DIAGNOSIS — J0110 Acute frontal sinusitis, unspecified: Secondary | ICD-10-CM | POA: Diagnosis not present

## 2023-06-23 ENCOUNTER — Emergency Department (HOSPITAL_COMMUNITY)
Admission: EM | Admit: 2023-06-23 | Discharge: 2023-06-23 | Disposition: A | Discharge status: Home / Self Care | Attending: Emergency Medicine | Admitting: Emergency Medicine

## 2023-06-23 ENCOUNTER — Emergency Department (HOSPITAL_COMMUNITY)

## 2023-06-23 DIAGNOSIS — M542 Cervicalgia: Secondary | ICD-10-CM | POA: Diagnosis not present

## 2023-06-23 DIAGNOSIS — M546 Pain in thoracic spine: Secondary | ICD-10-CM | POA: Insufficient documentation

## 2023-06-23 DIAGNOSIS — S0990XA Unspecified injury of head, initial encounter: Secondary | ICD-10-CM | POA: Diagnosis not present

## 2023-06-23 DIAGNOSIS — S0003XA Contusion of scalp, initial encounter: Secondary | ICD-10-CM | POA: Insufficient documentation

## 2023-06-23 DIAGNOSIS — R739 Hyperglycemia, unspecified: Secondary | ICD-10-CM | POA: Insufficient documentation

## 2023-06-23 DIAGNOSIS — M5020 Other cervical disc displacement, unspecified cervical region: Secondary | ICD-10-CM | POA: Diagnosis not present

## 2023-06-23 DIAGNOSIS — Y92410 Unspecified street and highway as the place of occurrence of the external cause: Secondary | ICD-10-CM | POA: Diagnosis not present

## 2023-06-23 DIAGNOSIS — M25519 Pain in unspecified shoulder: Secondary | ICD-10-CM | POA: Diagnosis not present

## 2023-06-23 DIAGNOSIS — R42 Dizziness and giddiness: Secondary | ICD-10-CM | POA: Diagnosis not present

## 2023-06-23 DIAGNOSIS — R519 Headache, unspecified: Secondary | ICD-10-CM | POA: Diagnosis not present

## 2023-06-23 DIAGNOSIS — M25511 Pain in right shoulder: Secondary | ICD-10-CM | POA: Diagnosis not present

## 2023-06-23 DIAGNOSIS — R110 Nausea: Secondary | ICD-10-CM | POA: Diagnosis not present

## 2023-06-23 LAB — CBC WITH DIFFERENTIAL/PLATELET
Abs Immature Granulocytes: 0.02 10*3/uL (ref 0.00–0.07)
Basophils Absolute: 0.1 10*3/uL (ref 0.0–0.1)
Basophils Relative: 1 %
Eosinophils Absolute: 0.2 10*3/uL (ref 0.0–0.5)
Eosinophils Relative: 3 %
HCT: 35.4 % — ABNORMAL LOW (ref 36.0–46.0)
Hemoglobin: 11.4 g/dL — ABNORMAL LOW (ref 12.0–15.0)
Immature Granulocytes: 0 %
Lymphocytes Relative: 33 %
Lymphs Abs: 2 10*3/uL (ref 0.7–4.0)
MCH: 27.8 pg (ref 26.0–34.0)
MCHC: 32.2 g/dL (ref 30.0–36.0)
MCV: 86.3 fL (ref 80.0–100.0)
Monocytes Absolute: 0.5 10*3/uL (ref 0.1–1.0)
Monocytes Relative: 7 %
Neutro Abs: 3.4 10*3/uL (ref 1.7–7.7)
Neutrophils Relative %: 56 %
Platelets: 384 10*3/uL (ref 150–400)
RBC: 4.1 MIL/uL (ref 3.87–5.11)
RDW: 13.5 % (ref 11.5–15.5)
WBC: 6.1 10*3/uL (ref 4.0–10.5)
nRBC: 0 % (ref 0.0–0.2)

## 2023-06-23 LAB — BASIC METABOLIC PANEL WITH GFR
Anion gap: 8 (ref 5–15)
BUN: 15 mg/dL (ref 6–20)
CO2: 25 mmol/L (ref 22–32)
Calcium: 8.9 mg/dL (ref 8.9–10.3)
Chloride: 104 mmol/L (ref 98–111)
Creatinine, Ser: 0.54 mg/dL (ref 0.44–1.00)
GFR, Estimated: >60 mL/min (ref >60)
Glucose, Bld: 107 mg/dL — ABNORMAL HIGH (ref 70–99)
Potassium: 4.1 mmol/L (ref 3.5–5.1)
Sodium: 137 mmol/L (ref 135–145)

## 2023-06-23 MED ORDER — LIDOCAINE 5 % EX PTCH
1.0000 | MEDICATED_PATCH | CUTANEOUS | 0 refills | Status: AC
Start: 2023-06-23 — End: ?

## 2023-06-23 MED ORDER — IBUPROFEN 800 MG PO TABS: 800.0000 mg | ORAL_TABLET | Freq: Three times a day (TID) | ORAL | 0 refills | Status: AC | PRN

## 2023-06-23 MED ORDER — SODIUM CHLORIDE 0.9 % IV BOLUS: 1000.0000 mL | Freq: Once | INTRAVENOUS | Status: DC

## 2023-06-23 NOTE — ED Notes (Signed)
 Pt transported to CT ?

## 2023-06-23 NOTE — ED Notes (Signed)
Pt ambulated to bathroom and back with no assistance
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
## 2023-06-23 NOTE — ED Notes (Signed)
 Two unsuccessful attempts at IV insertion by this nurse
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
## 2023-06-24 ENCOUNTER — Telehealth: Payer: Self-pay | Admitting: *Deleted

## 2023-06-24 NOTE — Telephone Encounter (Signed)
 Pt called to get work excuse letter.  RNCM printed note in chart and placed at registration for pt to pick up at her convenience.  Elih Mooney J. Rachel Budds, BSN, RN, NCM  Transitions of Care  Nurse Case Manager  [facility]  Operative Services  [phone]

## 2023-09-21 ENCOUNTER — Emergency Department (HOSPITAL_BASED_OUTPATIENT_CLINIC_OR_DEPARTMENT_OTHER)
Admission: EM | Admit: 2023-09-21 | Discharge: 2023-09-22 | Disposition: A | Discharge status: Home / Self Care | Attending: Emergency Medicine | Admitting: Emergency Medicine

## 2023-09-21 ENCOUNTER — Encounter (HOSPITAL_BASED_OUTPATIENT_CLINIC_OR_DEPARTMENT_OTHER): Payer: Self-pay | Admitting: Emergency Medicine

## 2023-09-21 DIAGNOSIS — R600 Localized edema: Secondary | ICD-10-CM | POA: Diagnosis not present

## 2023-09-21 DIAGNOSIS — M7989 Other specified soft tissue disorders: Secondary | ICD-10-CM | POA: Diagnosis not present

## 2023-09-21 DIAGNOSIS — R109 Unspecified abdominal pain: Secondary | ICD-10-CM | POA: Diagnosis not present

## 2023-09-21 DIAGNOSIS — R350 Frequency of micturition: Secondary | ICD-10-CM | POA: Diagnosis not present

## 2023-09-21 DIAGNOSIS — R300 Dysuria: Secondary | ICD-10-CM | POA: Diagnosis present

## 2023-09-21 LAB — CBC WITH DIFFERENTIAL/PLATELET
Abs Immature Granulocytes: 0.03 K/uL (ref 0.00–0.07)
Basophils Absolute: 0 K/uL (ref 0.0–0.1)
Basophils Relative: 0 %
Eosinophils Absolute: 0.1 K/uL (ref 0.0–0.5)
Eosinophils Relative: 1 %
HCT: 33.9 % — ABNORMAL LOW (ref 36.0–46.0)
Hemoglobin: 11.4 g/dL — ABNORMAL LOW (ref 12.0–15.0)
Immature Granulocytes: 0 %
Lymphocytes Relative: 24 %
Lymphs Abs: 2.3 K/uL (ref 0.7–4.0)
MCH: 28.4 pg (ref 26.0–34.0)
MCHC: 33.6 g/dL (ref 30.0–36.0)
MCV: 84.3 fL (ref 80.0–100.0)
Monocytes Absolute: 0.6 K/uL (ref 0.1–1.0)
Monocytes Relative: 6 %
Neutro Abs: 6.4 K/uL (ref 1.7–7.7)
Neutrophils Relative %: 69 %
Platelets: 333 K/uL (ref 150–400)
RBC: 4.02 MIL/uL (ref 3.87–5.11)
RDW: 13.1 % (ref 11.5–15.5)
WBC: 9.5 K/uL (ref 4.0–10.5)
nRBC: 0 % (ref 0.0–0.2)

## 2023-09-21 LAB — WET PREP, GENITAL
Clue Cells Wet Prep HPF POC: NONE SEEN
Sperm: NONE SEEN
Trich, Wet Prep: NONE SEEN
WBC, Wet Prep HPF POC: <10 (ref <10)
Yeast Wet Prep HPF POC: NONE SEEN

## 2023-09-21 LAB — BASIC METABOLIC PANEL WITH GFR
Anion gap: 13 (ref 5–15)
BUN: 13 mg/dL (ref 6–20)
CO2: 25 mmol/L (ref 22–32)
Calcium: 9.6 mg/dL (ref 8.9–10.3)
Chloride: 101 mmol/L (ref 98–111)
Creatinine, Ser: 0.79 mg/dL (ref 0.44–1.00)
GFR, Estimated: >60 mL/min (ref >60)
Glucose, Bld: 137 mg/dL — ABNORMAL HIGH (ref 70–99)
Potassium: 3.3 mmol/L — ABNORMAL LOW (ref 3.5–5.1)
Sodium: 139 mmol/L (ref 135–145)

## 2023-09-21 LAB — URINALYSIS, MICROSCOPIC (REFLEX)

## 2023-09-21 LAB — URINALYSIS, ROUTINE W REFLEX MICROSCOPIC
Glucose, UA: NEGATIVE mg/dL
Hgb urine dipstick: NEGATIVE
Ketones, ur: 40 mg/dL — AB
Nitrite: NEGATIVE
Protein, ur: 30 mg/dL — AB
Specific Gravity, Urine: >=1.03 (ref 1.005–1.030)
pH: 6 (ref 5.0–8.0)

## 2023-09-21 LAB — PREGNANCY, URINE: Preg Test, Ur: NEGATIVE

## 2023-09-21 MED ORDER — METRONIDAZOLE 500 MG PO TABS
2000.0000 mg | ORAL_TABLET | Freq: Once | ORAL | Status: AC
  Administered 2023-09-21: 2000 mg via ORAL
  Filled 2023-09-21: qty 4

## 2023-09-21 MED ORDER — CEFTRIAXONE SODIUM 500 MG IJ SOLR
500.0000 mg | Freq: Once | INTRAMUSCULAR | Status: AC
  Administered 2023-09-21: 500 mg via INTRAMUSCULAR
  Filled 2023-09-21: qty 500

## 2023-09-21 MED ORDER — FUROSEMIDE 20 MG PO TABS: 20.0000 mg | ORAL_TABLET | Freq: Every day | ORAL | 0 refills | Status: DC

## 2023-09-21 MED ORDER — AZITHROMYCIN 250 MG PO TABS
1000.0000 mg | ORAL_TABLET | Freq: Once | ORAL | Status: AC
  Administered 2023-09-21: 1000 mg via ORAL
  Filled 2023-09-21: qty 4

## 2023-09-21 MED ORDER — ONDANSETRON 4 MG PO TBDP
4.0000 mg | ORAL_TABLET | Freq: Once | ORAL | Status: AC
  Administered 2023-09-21: 4 mg via ORAL
  Filled 2023-09-21: qty 1

## 2023-09-21 NOTE — ED Triage Notes (Signed)
 Pt c/o lower back and pelvic pain x 3d; also reports BLE edema; has Lasix  at home for this, but it is not helping; c/o dysuria

## 2023-09-21 NOTE — Discharge Instructions (Addendum)
 Your urine did not look obviously infected.  I have sent swabs off to the test for sexually transmitted disease.  If it comes back positive you should get a call, if it is positive you should let your partners know up to 90 days before today so that they can also be tested and treated.

## 2023-09-21 NOTE — ED Provider Notes (Signed)
 [facility] Provider Note   CSN: [id number] Arrival date & time: 09/21/23  2209     Patient presents with: Abdominal Pain and Leg Swelling   April Blanchard is a 38 y.o. female.   38 yo F with a chief complaints of leg swelling back pain and urinary frequency and burning.  She is worried that she has a urinary tract infection.  She thinks that that is noted that she might have a sexually transmitted disease.  She tells me the leg swelling has been going on for a long time.  Sometimes it is worse than others and usually gets better with Lasix .  She thinks it is bad right now.  Tells me has been worked up but nobody knows the cause of it.   Abdominal Pain      Prior to Admission medications   Medication Sig Start Date End Date Taking? Authorizing Provider  furosemide  (LASIX ) 20 MG tablet Take 1 tablet (20 mg total) by mouth daily. 09/21/23  Yes Emil Share, DO  albuterol  (PROAIR  HFA) 108 (90 Base) MCG/ACT inhaler Inhale 2 puffs into the lungs every 6 (six) hours as needed for wheezing or shortness of breath.    [provider]  ALPRAZolam (XANAX) 0.5 MG tablet Take by mouth as needed.     [provider]  beclomethasone (QVAR) 80 MCG/ACT inhaler Inhale 1 puff into the lungs as needed.    [provider]  Boric Acid CRYS Place 600 mg vaginally at bedtime. Use vaginally every night for 21 days. DO NOT TAKE BY MOUTH 10/23/18   Corene Coy, MD  cetirizine (ZYRTEC) 10 MG tablet Take 10 mg by mouth daily.    [provider]  EPINEPHrine  (EPIPEN  2-PAK) 0.3 mg/0.3 mL IJ SOAJ injection Use as directed for severe allergic reaction. Patient not taking: Reported on 10/23/2018 12/19/16   Bobbitt, Elgin Pepper, MD  fexofenadine  (ALLEGRA ) 180 MG tablet Take 1 tablet (180 mg total) by mouth daily. Patient not taking: Reported on 10/23/2018 12/19/16   Bobbitt, Elgin Pepper, MD  fluticasone  (FLONASE ) 50 MCG/ACT nasal  spray Place 2 sprays into both nostrils daily. Patient not taking: Reported on 10/23/2018 02/29/16   Bobbitt, Elgin Pepper, MD  HYDROcodone -acetaminophen  (NORCO/VICODIN) 5-325 MG tablet Take 1 tablet by mouth every 6 (six) hours as needed. 05/14/23   Horton, Roxie HERO, DO  levonorgestrel  (MIRENA , 52 MG,) 20 MCG/24HR IUD Mirena  20 mcg/24 hr (5 years) intrauterine device  Take by intrauterine route. 06/21/16   [provider]  lidocaine  (LIDODERM ) 5 % Place 1 patch onto the skin daily. Remove & Discard patch within 12 hours or as directed by MD 06/23/23   Hinnant, Collin F, PA-C  montelukast  (SINGULAIR ) 10 MG tablet Take 1 tablet (10 mg total) by mouth at bedtime. Patient not taking: Reported on 07/01/2016 02/29/16   Bobbitt, Elgin Pepper, MD  olopatadine  (PATANOL) 0.1 % ophthalmic solution Place 1 drop into both eyes daily as needed for allergies. Patient not taking: Reported on 07/01/2016 02/29/16   Green Elgin Pepper, MD  ranitidine  (ZANTAC ) 150 MG tablet 1 tablet twice daily Patient not taking: Reported on 10/23/2018 12/19/16   Bobbitt, Elgin Pepper, MD    Allergies: Patient has no known allergies.    Review of Systems  Gastrointestinal:  Positive for abdominal pain.    Updated Vital Signs BP 120/72   Pulse 79   Temp 98.5 F (36.9 C)   Resp 18   Ht 5' 4 (  1.626 m)   Wt 80.3 kg   SpO2 100%   BMI 30.38 kg/m   Physical Exam Vitals and nursing note reviewed.  Constitutional:      General: She is not in acute distress.    Appearance: She is well-developed. She is not diaphoretic.  HENT:     Head: Normocephalic and atraumatic.  Eyes:     Pupils: Pupils are equal, round, and reactive to light.  Cardiovascular:     Rate and Rhythm: Normal rate and regular rhythm.     Heart sounds: No murmur heard.    No friction rub. No gallop.  Pulmonary:     Effort: Pulmonary effort is normal.     Breath sounds: No wheezing or rales.  Abdominal:     General: There is no distension.      Palpations: Abdomen is soft.     Tenderness: There is no abdominal tenderness.  Musculoskeletal:        General: No tenderness.     Cervical back: Normal range of motion and neck supple.     Comments: I do not appreciate any obvious edema.  Pulse motor and sensation intact distally.  Skin:    General: Skin is warm and dry.  Neurological:     Mental Status: She is alert and oriented to person, place, and time.  Psychiatric:        Behavior: Behavior normal.     (all labs ordered are listed, but only abnormal results are displayed) Labs Reviewed  URINALYSIS, ROUTINE W REFLEX MICROSCOPIC - Abnormal; Notable for the following components:      Result Value   Bilirubin Urine SMALL (*)    Ketones, ur 40 (*)    Protein, ur 30 (*)    Leukocytes,Ua TRACE (*)    All other components within normal limits  CBC WITH DIFFERENTIAL/PLATELET - Abnormal; Notable for the following components:   Hemoglobin 11.4 (*)    HCT 33.9 (*)    All other components within normal limits  URINALYSIS, MICROSCOPIC (REFLEX) - Abnormal; Notable for the following components:   Bacteria, UA FEW (*)    All other components within normal limits  WET PREP, GENITAL  PREGNANCY, URINE  BASIC METABOLIC PANEL WITH GFR  GC/CHLAMYDIA PROBE AMP ([place]) NOT AT [facility] ([facility])    EKG: None  Radiology: No results found.   Procedures   Medications Ordered in the ED  cefTRIAXone  (ROCEPHIN ) injection 500 mg (500 mg Intramuscular Given 09/21/23 2323)  azithromycin  (ZITHROMAX ) tablet 1,000 mg (1,000 mg Oral Given 09/21/23 2322)  metroNIDAZOLE  (FLAGYL ) tablet 2,000 mg (2,000 mg Oral Given 09/21/23 2322)                                    Medical Decision Making Amount and/or Complexity of Data Reviewed Labs: ordered.  Risk Prescription drug management.   38 yo F with multiple complaints.  She tells me that she is worried that maybe she has a urinary tract infection and is also worried that her legs are swollen.  Says  that she has urinary symptoms as well as back pain which she had before with urinary tract infection.  She also thinks it could be a sexually transmitted disease.  She is also worried about her swollen legs.  Tells me they have been that way for about 4 days.  Seems to come and go.  Usually gets better with Lasix .  Will  check electrolytes.  UA send off GC swabs.  Signed out to Dr. Geroldine, please see their note for further details of care in the ED.  The patients results and plan were reviewed and discussed.   Any x-rays performed were independently reviewed by myself.   Differential diagnosis were considered with the presenting HPI.  Medications  cefTRIAXone  (ROCEPHIN ) injection 500 mg (500 mg Intramuscular Given 09/21/23 2323)  azithromycin  (ZITHROMAX ) tablet 1,000 mg (1,000 mg Oral Given 09/21/23 2322)  metroNIDAZOLE  (FLAGYL ) tablet 2,000 mg (2,000 mg Oral Given 09/21/23 2322)    Vitals:   09/21/23 2226 09/21/23 2228  BP: 120/72   Pulse: 79   Resp:  18  Temp:  98.5 F (36.9 C)  SpO2: 100%   Weight: 80.3 kg   Height: 5' 4 (1.626 m)     Final diagnoses:  Peripheral edema  Dysuria       Final diagnoses:  Peripheral edema  Dysuria    ED Discharge Orders          Ordered    furosemide  (LASIX ) 20 MG tablet  Daily        09/21/23 2318               Emil Share, DO 09/21/23 2323

## 2023-09-22 DIAGNOSIS — R300 Dysuria: Secondary | ICD-10-CM | POA: Diagnosis not present

## 2023-09-22 MED ORDER — ONDANSETRON 8 MG PO TBDP: ORAL_TABLET | ORAL | 0 refills | Status: AC

## 2023-09-22 MED ORDER — ONDANSETRON 4 MG PO TBDP
4.0000 mg | ORAL_TABLET | Freq: Once | ORAL | Status: AC
  Administered 2023-09-22: 4 mg via ORAL
  Filled 2023-09-22: qty 1

## 2023-09-22 NOTE — ED Provider Notes (Signed)
  Physical Exam  BP 120/72   Pulse 79   Temp 98.5 F (36.9 C)   Resp 18   Ht 5' 4 (1.626 m)   Wt 80.3 kg   SpO2 100%   BMI 30.38 kg/m   Physical Exam Vitals and nursing note reviewed.  Constitutional:      Appearance: She is well-developed.  HENT:     Head: Normocephalic.  Neurological:     Mental Status: She is alert.     Procedures  Procedures  ED Course / MDM    Medical Decision Making Amount and/or Complexity of Data Reviewed Labs: ordered.  Risk Prescription drug management.   Care assumed from Dr. Emil at shift change.  Patient awaiting results of metabolic panel prior to prescribing Lasix .  This has returned and her renal function is normal.  Patient to be discharged with Lasix  and as needed return.       Geroldine Berg, MD 09/22/23 [phone]

## 2023-09-23 LAB — GC/CHLAMYDIA PROBE AMP (~~LOC~~) NOT AT ARMC
Chlamydia: NEGATIVE
Comment: NEGATIVE
Comment: NORMAL
Neisseria Gonorrhea: NEGATIVE

## 2024-01-14 ENCOUNTER — Ambulatory Visit
Admission: EM | Admit: 2024-01-14 | Discharge: 2024-01-14 | Disposition: A | Discharge status: Home / Self Care | Attending: Family Medicine | Admitting: Family Medicine

## 2024-01-14 DIAGNOSIS — M7989 Other specified soft tissue disorders: Secondary | ICD-10-CM | POA: Insufficient documentation

## 2024-01-14 DIAGNOSIS — N898 Other specified noninflammatory disorders of vagina: Secondary | ICD-10-CM | POA: Diagnosis not present

## 2024-01-14 DIAGNOSIS — N3001 Acute cystitis with hematuria: Secondary | ICD-10-CM | POA: Insufficient documentation

## 2024-01-14 LAB — POCT URINE DIPSTICK
Bilirubin, UA: NEGATIVE
Glucose, UA: NEGATIVE mg/dL
Ketones, POC UA: NEGATIVE mg/dL
Nitrite, UA: POSITIVE — AB
Protein Ur, POC: NEGATIVE mg/dL
Spec Grav, UA: 1.02
Urobilinogen, UA: 0.2 U/dL
pH, UA: 7

## 2024-01-14 MED ORDER — FLUCONAZOLE 150 MG PO TABS: 150.0000 mg | ORAL_TABLET | ORAL | 0 refills | Status: AC

## 2024-01-14 MED ORDER — METRONIDAZOLE 0.75 % VA GEL: 1.0000 | Freq: Every day | VAGINAL | 0 refills | Status: AC

## 2024-01-14 MED ORDER — CEPHALEXIN 500 MG PO CAPS: 500.0000 mg | ORAL_CAPSULE | Freq: Two times a day (BID) | ORAL | 0 refills | Status: AC

## 2024-01-14 NOTE — ED Provider Notes (Signed)
 " [facility]  Note:  This document was prepared using Conservation officer, historic buildings and may include unintentional dictation errors.  MRN: [id number] DOB: 1985/06/24  Subjective:   April Blanchard is a 38 y.o. female presenting for 2 chief complaints.  Reports 1 week history of vaginal irritation and vaginal odor. Has had malodorous urine, mild dysuria, urinary frequency. Has gotten vaginal cytology done multiple times in the past 4 months, testing has been negative. LMP was ~2 weeks ago. Has ~2-3 bottles of water daily.  Reports 3 day history of swelling of the left lower leg. Used to use Lasix  for this.  Works at a [employer].  Stands for the majority of her shifts.  Has compression socks but does not use them.  Current Outpatient Medications  Medication Instructions   albuterol  (PROAIR  HFA) 108 (90 Base) MCG/ACT inhaler 2 puffs, Inhalation, Every 6 hours PRN   ALPRAZolam (XANAX) 0.5 MG tablet Oral, As needed   beclomethasone (QVAR) 80 MCG/ACT inhaler 1 puff, Inhalation, As needed   Boric Acid CRYS 600 mg, Vaginal, Daily at bedtime, Use vaginally every night for 21 days. DO NOT TAKE BY MOUTH   cetirizine (ZYRTEC) 10 mg, Oral, Daily   EPINEPHrine  (EPIPEN  2-PAK) 0.3 mg/0.3 mL IJ SOAJ injection Use as directed for severe allergic reaction.   fexofenadine  (ALLEGRA ) 180 mg, Oral, Daily   fluticasone  (FLONASE ) 50 MCG/ACT nasal spray 2 sprays, Each Nare, Daily   furosemide  (LASIX ) 20 mg, Oral, Daily   HYDROcodone -acetaminophen  (NORCO/VICODIN) 5-325 MG tablet 1 tablet, Oral, Every 6 hours PRN   levonorgestrel  (MIRENA , 52 MG,) 20 MCG/24HR IUD Mirena  20 mcg/24 hr (5 years) intrauterine device  Take by intrauterine route.   lidocaine  (LIDODERM ) 5 % 1 patch, Transdermal, Every 24 hours, Remove & Discard patch within 12 hours or as directed by MD   montelukast  (SINGULAIR ) 10 mg, Oral, Daily at bedtime   olopatadine  (PATANOL) 0.1 % ophthalmic solution 1 drop, Both Eyes, Daily PRN    ondansetron  (ZOFRAN -ODT) 8 MG disintegrating tablet 8mg  ODT q4 hours prn nausea   ranitidine  (ZANTAC ) 150 MG tablet 1 tablet twice daily    Allergies[1]  Past Medical History:  Diagnosis Date   Anemia    Asthma    albuterol  i month ago   Palpitations      Past Surgical History:  Procedure Laterality Date   TONSILLECTOMY      Family History  Problem Relation Age of Onset   Diabetes Mother    Asthma Mother    Asthma Brother    Asthma Maternal Uncle    Diabetes Maternal Grandmother    Asthma Father    Other Neg Hx     Social History   Occupational History   Not on file  Tobacco Use   Smoking status: Never   Smokeless tobacco: Never  Vaping Use   Vaping status: Never Used  Substance and Sexual Activity   Alcohol use: No   Drug use: No   Sexual activity: Yes    Birth control/protection: I.U.D.     ROS   Objective:   Vitals: BP 115/74 (BP Location: Right Arm)   Pulse 83   Temp 97.8 F (36.6 C) (Oral)   Resp 18   Ht 5' 6 (1.676 m)   Wt 180 lb (81.6 kg)   LMP 12/31/2023   SpO2 97%   BMI 29.05 kg/m   Physical Exam Constitutional:      General: She is not in acute distress.  Appearance: Normal appearance. She is well-developed. She is not ill-appearing, toxic-appearing or diaphoretic.  HENT:     Head: Normocephalic and atraumatic.     Nose: Nose normal.     Mouth/Throat:     Mouth: Mucous membranes are moist.     Pharynx: Oropharynx is clear.  Eyes:     General: No scleral icterus.       Right eye: No discharge.        Left eye: No discharge.     Extraocular Movements: Extraocular movements intact.     Conjunctiva/sclera: Conjunctivae normal.  Cardiovascular:     Rate and Rhythm: Normal rate.  Pulmonary:     Effort: Pulmonary effort is normal.  Abdominal:     General: Bowel sounds are normal. There is no distension.     Palpations: Abdomen is soft. There is no mass.     Tenderness: There is no abdominal tenderness. There is no right CVA  tenderness, left CVA tenderness, guarding or rebound.  Musculoskeletal:        General: Swelling (bilaterally) present.  Skin:    General: Skin is warm and dry.  Neurological:     General: No focal deficit present.     Mental Status: She is alert and oriented to person, place, and time.  Psychiatric:        Mood and Affect: Mood normal.        Behavior: Behavior normal.        Thought Content: Thought content normal.        Judgment: Judgment normal.    Results for orders placed or performed during the hospital encounter of 01/14/24 (from the past 24 hours)  POCT URINE DIPSTICK     Status: Abnormal   Collection Time: 01/14/24  3:53 PM  Result Value Ref Range   Color, UA yellow yellow   Clarity, UA clear clear   Glucose, UA negative negative mg/dL   Bilirubin, UA negative negative   Ketones, POC UA negative negative mg/dL   Spec Grav, UA 8.979 8.989 - 1.025   Blood, UA trace-intact (A) negative   pH, UA 7.0 5.0 - 8.0   Protein Ur, POC negative negative mg/dL   Urobilinogen, UA 0.2 0.2 or 1.0 E.U./dL   Nitrite, UA Positive (A) Negative   Leukocytes, UA Small (1+) (A) Negative    Assessment and Plan :   PDMP not reviewed this encounter.  1. Acute cystitis with hematuria   2. Vaginal discharge   3. Swelling of lower leg      Patient request empiric treatment for BV and yeast infection and therefore was agreeable to use MetroGel , fluconazole .  The latter is especially important as she has signs of acute cystitis and recommended cephalexin .  Labs pending.  Emphasized hydrating more consistently.  Regarding her lower leg swelling, no suspicion for DVT.  Counseled against the diuretic given her blood pressure.  Recommended rest, propping her legs up, using compression socks consistently.  Counseled patient on potential for adverse effects with medications prescribed/recommended today, ER and return-to-clinic precautions discussed, patient verbalized understanding.     [1] No  Known Allergies    Christopher Savannah, PA-C 01/14/24 1605  "

## 2024-01-14 NOTE — ED Triage Notes (Signed)
 Pt states that she has some vaginal irritation and vaginal odor. X1 week  Pt states that she also has some swelling of her left leg. X3 days  Pt states that she use to be on Lasix .

## 2024-01-14 NOTE — Discharge Instructions (Addendum)
 Please start cephalexin  to address an urinary tract infection. Use fluconazole  to help with yeast infection. Make sure you hydrate very well with plain water and a quantity of 80 ounces of water a day.  Please limit drinks that are considered urinary irritants such as fruit juices, soda, sweet tea, coffee, artifical sweetened drinks, energy drinks, alcohol.  These can worsen your urinary and genital symptoms but also be the source of them.  I will let you know about your urine culture results and vaginal cytology through MyChart to see if we need to prescribe or change your antibiotics based off of those results.

## 2024-01-16 ENCOUNTER — Ambulatory Visit (HOSPITAL_COMMUNITY): Payer: Self-pay

## 2024-01-16 LAB — CERVICOVAGINAL ANCILLARY ONLY
Bacterial Vaginitis (gardnerella): POSITIVE — AB
Candida Glabrata: NEGATIVE
Candida Vaginitis: NEGATIVE
Chlamydia: NEGATIVE
Comment: NEGATIVE
Comment: NEGATIVE
Comment: NEGATIVE
Comment: NEGATIVE
Comment: NEGATIVE
Comment: NORMAL
Neisseria Gonorrhea: NEGATIVE
Trichomonas: NEGATIVE

## 2024-01-19 ENCOUNTER — Emergency Department (HOSPITAL_BASED_OUTPATIENT_CLINIC_OR_DEPARTMENT_OTHER)
Admission: EM | Admit: 2024-01-19 | Discharge: 2024-01-20 | Disposition: A | Attending: Emergency Medicine | Admitting: Emergency Medicine

## 2024-01-19 ENCOUNTER — Other Ambulatory Visit: Payer: Self-pay

## 2024-01-19 ENCOUNTER — Emergency Department (HOSPITAL_BASED_OUTPATIENT_CLINIC_OR_DEPARTMENT_OTHER)

## 2024-01-19 ENCOUNTER — Encounter (HOSPITAL_BASED_OUTPATIENT_CLINIC_OR_DEPARTMENT_OTHER): Payer: Self-pay

## 2024-01-19 DIAGNOSIS — R600 Localized edema: Secondary | ICD-10-CM | POA: Diagnosis present

## 2024-01-19 LAB — CBC WITH DIFFERENTIAL/PLATELET
Abs Immature Granulocytes: 0.03 K/uL (ref 0.00–0.07)
Basophils Absolute: 0 K/uL (ref 0.0–0.1)
Basophils Relative: 1 %
Eosinophils Absolute: 0.2 K/uL (ref 0.0–0.5)
Eosinophils Relative: 2 %
HCT: 33.4 % — ABNORMAL LOW (ref 36.0–46.0)
Hemoglobin: 11.1 g/dL — ABNORMAL LOW (ref 12.0–15.0)
Immature Granulocytes: 0 %
Lymphocytes Relative: 23 %
Lymphs Abs: 1.8 K/uL (ref 0.7–4.0)
MCH: 28.5 pg (ref 26.0–34.0)
MCHC: 33.2 g/dL (ref 30.0–36.0)
MCV: 85.6 fL (ref 80.0–100.0)
Monocytes Absolute: 0.5 K/uL (ref 0.1–1.0)
Monocytes Relative: 6 %
Neutro Abs: 5.5 K/uL (ref 1.7–7.7)
Neutrophils Relative %: 68 %
Platelets: 387 K/uL (ref 150–400)
RBC: 3.9 MIL/uL (ref 3.87–5.11)
RDW: 13.3 % (ref 11.5–15.5)
WBC: 8 K/uL (ref 4.0–10.5)
nRBC: 0 % (ref 0.0–0.2)

## 2024-01-19 LAB — COMPREHENSIVE METABOLIC PANEL WITH GFR
ALT: 11 U/L (ref 0–44)
AST: 13 U/L — ABNORMAL LOW (ref 15–41)
Albumin: 4.3 g/dL (ref 3.5–5.0)
Alkaline Phosphatase: 55 U/L (ref 38–126)
Anion gap: 12 (ref 5–15)
BUN: 12 mg/dL (ref 6–20)
CO2: 25 mmol/L (ref 22–32)
Calcium: 9.2 mg/dL (ref 8.9–10.3)
Chloride: 102 mmol/L (ref 98–111)
Creatinine, Ser: 0.53 mg/dL (ref 0.44–1.00)
GFR, Estimated: >60 mL/min
Glucose, Bld: 148 mg/dL — ABNORMAL HIGH (ref 70–99)
Potassium: 3.5 mmol/L (ref 3.5–5.1)
Sodium: 139 mmol/L (ref 135–145)
Total Bilirubin: 0.3 mg/dL (ref 0.0–1.2)
Total Protein: 7.1 g/dL (ref 6.5–8.1)

## 2024-01-19 LAB — PRO BRAIN NATRIURETIC PEPTIDE: Pro Brain Natriuretic Peptide: <50 pg/mL

## 2024-01-19 LAB — TROPONIN T, HIGH SENSITIVITY: Troponin T High Sensitivity: <15 ng/L (ref 0–19)

## 2024-01-19 NOTE — ED Provider Notes (Signed)
 " [place] [facility] Provider Note   CSN: [id number] Arrival date & time: 01/19/24  2142     Patient presents with: Shortness of Breath and Chest Pain   April Blanchard is a 38 y.o. female.  {Add pertinent medical, surgical, social history, OB history to YEP:67052} Presents to the emergency department for evaluation of leg swelling.  Patient reports that both of her legs are very swollen.  This has been ongoing for several days.  In the last 24 hours she started feeling short of breath and cannot perform her job which is very demanding, lots of standing and walking.  Patient reports that she has a lifelong history of lower extremity edema and when her legs swell this much, she usually takes Lasix  for a week and it improves.  She does not currently have any Lasix .       Prior to Admission medications  Medication Sig Start Date End Date Taking? Authorizing Provider  albuterol  (PROAIR  HFA) 108 (90 Base) MCG/ACT inhaler Inhale 2 puffs into the lungs every 6 (six) hours as needed for wheezing or shortness of breath.    [provider]  ALPRAZolam (XANAX) 0.5 MG tablet Take by mouth as needed.     [provider]  beclomethasone (QVAR) 80 MCG/ACT inhaler Inhale 1 puff into the lungs as needed.    [provider]  Boric Acid CRYS Place 600 mg vaginally at bedtime. Use vaginally every night for 21 days. DO NOT TAKE BY MOUTH 10/23/18   Corene Coy, MD  cephALEXin  (KEFLEX ) 500 MG capsule Take 1 capsule (500 mg total) by mouth 2 (two) times daily. 01/14/24   Seif Teichert Savannah, PA-C  cetirizine (ZYRTEC) 10 MG tablet Take 10 mg by mouth daily.    [provider]  EPINEPHrine  (EPIPEN  2-PAK) 0.3 mg/0.3 mL IJ SOAJ injection Use as directed for severe allergic reaction. Patient not taking: Reported on 10/23/2018 12/19/16   Bobbitt, Elgin Pepper, MD  fexofenadine  (ALLEGRA ) 180 MG tablet Take 1 tablet (180 mg total) by mouth  daily. Patient not taking: Reported on 10/23/2018 12/19/16   Bobbitt, Elgin Pepper, MD  fluconazole  (DIFLUCAN ) 150 MG tablet Take 1 tablet (150 mg total) by mouth every 3 (three) days. 01/14/24   Cabrina Shiroma Savannah, PA-C  fluticasone  (FLONASE ) 50 MCG/ACT nasal spray Place 2 sprays into both nostrils daily. Patient not taking: Reported on 10/23/2018 02/29/16   Bobbitt, Elgin Pepper, MD  furosemide  (LASIX ) 20 MG tablet Take 1 tablet (20 mg total) by mouth daily. 09/21/23   Floyd, Dan, DO  HYDROcodone -acetaminophen  (NORCO/VICODIN) 5-325 MG tablet Take 1 tablet by mouth every 6 (six) hours as needed. 05/14/23   Horton, Roxie HERO, DO  levonorgestrel  (MIRENA , 52 MG,) 20 MCG/24HR IUD Mirena  20 mcg/24 hr (5 years) intrauterine device  Take by intrauterine route. 06/21/16   [provider]  lidocaine  (LIDODERM ) 5 % Place 1 patch onto the skin daily. Remove & Discard patch within 12 hours or as directed by MD 06/23/23   Hinnant, Collin F, PA-C  metroNIDAZOLE  (METROGEL ) 0.75 % vaginal gel Place 1 Applicatorful vaginally daily. Insert one applicator vaginally once daily. 01/14/24   America Sandall Savannah, PA-C  montelukast  (SINGULAIR ) 10 MG tablet Take 1 tablet (10 mg total) by mouth at bedtime. Patient not taking: Reported on 07/01/2016 02/29/16   Bobbitt, Elgin Pepper, MD  olopatadine  (PATANOL) 0.1 % ophthalmic solution Place 1 drop into both eyes daily as needed for allergies. Patient not taking: Reported on 07/01/2016 02/29/16  Bobbitt, Elgin Pepper, MD  ondansetron  (ZOFRAN -ODT) 8 MG disintegrating tablet 8mg  ODT q4 hours prn nausea 09/22/23   Geroldine Berg, MD  ranitidine  (ZANTAC ) 150 MG tablet 1 tablet twice daily Patient not taking: Reported on 10/23/2018 12/19/16   Bobbitt, Elgin Pepper, MD    Allergies: Patient has no known allergies.    Review of Systems  Updated Vital Signs BP (!) 126/92 (BP Location: Right Arm)   Pulse 97   Temp 98.5 F (36.9 C)   Resp (!) 22   Ht 5' 6 (1.676 m)   Wt 81 kg   LMP 12/31/2023  (Exact Date)   SpO2 100%   BMI 28.82 kg/m   Physical Exam Vitals and nursing note reviewed.  Constitutional:      General: She is not in acute distress.    Appearance: She is well-developed.  HENT:     Head: Normocephalic and atraumatic.     Mouth/Throat:     Mouth: Mucous membranes are moist.  Eyes:     General: Vision grossly intact. Gaze aligned appropriately.     Extraocular Movements: Extraocular movements intact.     Conjunctiva/sclera: Conjunctivae normal.  Cardiovascular:     Rate and Rhythm: Normal rate and regular rhythm.     Pulses:          Dorsalis pedis pulses are 1+ on the right side and 1+ on the left side.     Heart sounds: Normal heart sounds, S1 normal and S2 normal. No murmur heard.    No friction rub. No gallop.  Pulmonary:     Effort: Pulmonary effort is normal. No respiratory distress.     Breath sounds: Normal breath sounds.  Abdominal:     General: Bowel sounds are normal.     Palpations: Abdomen is soft.     Tenderness: There is no abdominal tenderness. There is no guarding or rebound.     Hernia: No hernia is present.  Musculoskeletal:        General: No swelling.     Cervical back: Full passive range of motion without pain, normal range of motion and neck supple. No spinous process tenderness or muscular tenderness. Normal range of motion.     Right lower leg: Edema present.     Left lower leg: Edema present.  Skin:    General: Skin is warm and dry.     Capillary Refill: Capillary refill takes less than 2 seconds.     Findings: No ecchymosis, erythema, rash or wound.  Neurological:     General: No focal deficit present.     Mental Status: She is alert and oriented to person, place, and time.     GCS: GCS eye subscore is 4. GCS verbal subscore is 5. GCS motor subscore is 6.     Cranial Nerves: Cranial nerves 2-12 are intact.     Sensory: Sensation is intact.     Motor: Motor function is intact.     Coordination: Coordination is intact.   Psychiatric:        Attention and Perception: Attention normal.        Mood and Affect: Mood normal.        Speech: Speech normal.        Behavior: Behavior normal.     (all labs ordered are listed, but only abnormal results are displayed) Labs Reviewed  CBC WITH DIFFERENTIAL/PLATELET - Abnormal; Notable for the following components:      Result Value   Hemoglobin 11.1 (*)  HCT 33.4 (*)    All other components within normal limits  COMPREHENSIVE METABOLIC PANEL WITH GFR - Abnormal; Notable for the following components:   Glucose, Bld 148 (*)    AST 13 (*)    All other components within normal limits  PRO BRAIN NATRIURETIC PEPTIDE  TROPONIN T, HIGH SENSITIVITY    EKG: EKG Interpretation Date/Time:  Monday January 19 2024 21:58:53 EST Ventricular Rate:  88 PR Interval:  165 QRS Duration:  86 QT Interval:  370 QTC Calculation: 448 R Axis:   -5  Text Interpretation: Sinus rhythm Low voltage, precordial leads No significant change since last tracing Confirmed by Haze Lonni PARAS [phone]) on 01/19/2024 11:34:11 PM  Radiology: No results found.  {Document cardiac monitor, telemetry assessment procedure when appropriate:32947} Procedures   Medications Ordered in the ED - No data to display    {Click here for ABCD2, HEART and other calculators REFRESH Note before signing:1}                              Medical Decision Making Amount and/or Complexity of Data Reviewed Labs: ordered. Decision-making details documented in ED Course. Radiology: ordered and independent interpretation performed. Decision-making details documented in ED Course. ECG/medicine tests: ordered and independent interpretation performed. Decision-making details documented in ED Course.   Differential Diagnosis considered includes, but not limited to: CHF exacerbation; peripheral edema; dvt  Patient reports that she has a lifelong history of peripheral edema.  Symptoms are similar to what she  has had with her edema in the past but does not have the Lasix  that she normally treats it with.  Patient appears well.  No oxygen desaturation on room air.  Swelling is symmetric and nonpitting.  Presentation does not suggest a DVT.  Additionally, her workup has been reassuring, no signs of congestive heart failure.  Chest x-ray read by me does not show any evidence of pneumonia, CHF, acute pathology.  Patient reports that she was seen in urgent care several days ago and told that she had a UTI and BV.  She was treated for UTI with Keflex  but was not given anything for her BV, has some discomfort and odor.  Requesting treatment.   {Document critical care time when appropriate  Document review of labs and clinical decision tools ie [id number], etc  Document your independent review of radiology images and any outside records  Document your discussion with family members, caretakers and with consultants  Document social determinants of health affecting pt's care  Document your decision making why or why not admission, treatments were needed:32947:::1}   Final diagnoses:  None    ED Discharge Orders     None        "

## 2024-01-19 NOTE — ED Triage Notes (Addendum)
 Pt states she became [facility] yesterday.  Noticeably SHOB walking into triage.   Bilateral leg edema, non-pitting.  Usually takes Lasix  and has been out x 1 month Recently went to [facility] dx with UTI on Cephalexin  And she saw her results today and she also BV and was only prescribed a cream

## 2024-01-20 DIAGNOSIS — R600 Localized edema: Secondary | ICD-10-CM | POA: Diagnosis not present

## 2024-01-20 MED ORDER — METRONIDAZOLE 500 MG PO TABS: 500.0000 mg | ORAL_TABLET | Freq: Two times a day (BID) | ORAL | 0 refills | Status: AC

## 2024-01-20 MED ORDER — FUROSEMIDE 40 MG PO TABS
40.0000 mg | ORAL_TABLET | ORAL | Status: AC
  Administered 2024-01-20: 40 mg via ORAL
  Filled 2024-01-20: qty 1

## 2024-01-20 MED ORDER — FUROSEMIDE 20 MG PO TABS: 20.0000 mg | ORAL_TABLET | Freq: Every day | ORAL | 0 refills | Status: AC
